# Patient Record
Sex: Male | Born: 1945 | Race: White | Hispanic: No | Marital: Married | Smoking: Former smoker
Health system: Southern US, Community
[De-identification: ages and names within clinical notes are randomized; demographics above are authoritative.]

## PROBLEM LIST (undated history)

## (undated) DIAGNOSIS — I10 Essential (primary) hypertension: Secondary | ICD-10-CM

## (undated) DIAGNOSIS — J449 Chronic obstructive pulmonary disease, unspecified: Secondary | ICD-10-CM

---

## 2007-05-18 ENCOUNTER — Ambulatory Visit: Payer: Self-pay | Admitting: Gastroenterology

## 2008-06-07 ENCOUNTER — Ambulatory Visit: Payer: Self-pay | Admitting: Family Medicine

## 2008-08-15 ENCOUNTER — Ambulatory Visit: Payer: Self-pay | Admitting: Sports Medicine

## 2009-05-13 ENCOUNTER — Ambulatory Visit: Payer: Self-pay | Admitting: Cardiology

## 2014-06-05 DIAGNOSIS — Z87891 Personal history of nicotine dependence: Secondary | ICD-10-CM | POA: Insufficient documentation

## 2017-11-23 ENCOUNTER — Ambulatory Visit: Payer: Self-pay | Admitting: Podiatry

## 2017-11-28 ENCOUNTER — Encounter: Payer: Self-pay | Admitting: Podiatry

## 2017-11-28 ENCOUNTER — Other Ambulatory Visit: Payer: Self-pay | Admitting: Podiatry

## 2017-11-28 ENCOUNTER — Ambulatory Visit (INDEPENDENT_AMBULATORY_CARE_PROVIDER_SITE_OTHER): Payer: No Typology Code available for payment source | Admitting: Podiatry

## 2017-11-28 ENCOUNTER — Ambulatory Visit (INDEPENDENT_AMBULATORY_CARE_PROVIDER_SITE_OTHER): Payer: No Typology Code available for payment source

## 2017-11-28 ENCOUNTER — Ambulatory Visit: Payer: Self-pay | Admitting: Podiatry

## 2017-11-28 VITALS — BP 114/59 | HR 82 | Temp 97.5°F

## 2017-11-28 DIAGNOSIS — M79672 Pain in left foot: Principal | ICD-10-CM

## 2017-11-28 DIAGNOSIS — M79676 Pain in unspecified toe(s): Secondary | ICD-10-CM | POA: Diagnosis not present

## 2017-11-28 DIAGNOSIS — E785 Hyperlipidemia, unspecified: Secondary | ICD-10-CM | POA: Insufficient documentation

## 2017-11-28 DIAGNOSIS — J449 Chronic obstructive pulmonary disease, unspecified: Secondary | ICD-10-CM | POA: Insufficient documentation

## 2017-11-28 DIAGNOSIS — B351 Tinea unguium: Secondary | ICD-10-CM

## 2017-11-28 DIAGNOSIS — M722 Plantar fascial fibromatosis: Secondary | ICD-10-CM

## 2017-11-28 DIAGNOSIS — I10 Essential (primary) hypertension: Secondary | ICD-10-CM | POA: Insufficient documentation

## 2017-11-28 DIAGNOSIS — M79671 Pain in right foot: Secondary | ICD-10-CM

## 2017-11-28 DIAGNOSIS — Z77090 Contact with and (suspected) exposure to asbestos: Secondary | ICD-10-CM | POA: Insufficient documentation

## 2017-11-28 DIAGNOSIS — E669 Obesity, unspecified: Secondary | ICD-10-CM | POA: Insufficient documentation

## 2017-11-28 NOTE — Progress Notes (Signed)
   SUBJECTIVE 72 year old male presenting today as a new patient with a chief complaint of bilateral foot pain that began over 5 years ago. He reports associated numbness and tingling of the toes as well as swelling and a burning sensation to the feet. He states the pain is located to the dorsum of the foot as well as the heels and toes. Walking and standing for long periods of time increases the pain. He has not done anything for treatment. Patient is also complaining of elongated, thickened nails that cause pain while ambulating in shoes. He is unable to trim his own nails. Patient is here for further evaluation and treatment.  History reviewed. No pertinent past medical history.  OBJECTIVE General Patient is awake, alert, and oriented x 3 and in no acute distress.  Derm Skin is dry and supple bilateral. Negative open lesions or macerations. Remaining integument unremarkable. Nails are tender, long, thickened and dystrophic with subungual debris, consistent with onychomycosis, 1-5 bilateral. No signs of infection noted.  Vasc  DP and PT pedal pulses palpable bilaterally. Temperature gradient within normal limits.   Neuro Epicritic and protective threshold sensation grossly intact bilaterally.   Musculoskeletal Exam Tenderness to palpation to the plantar aspect of the bilateral heels along the plantar fascia. All other joints range of motion within normal limits bilateral. Strength 5/5 in all groups bilateral.   Radiographic exam: Normal osseous mineralization. Joint spaces preserved. No fracture/dislocation/boney destruction. No other soft tissue abnormalities or radiopaque foreign bodies.   ASSESSMENT 1. Onychodystrophic nails 1-5 bilateral with hyperkeratosis of nails.  2. Onychomycosis of nail due to dermatophyte bilateral 3. Pain in foot bilateral 4. Plantar fasciitis bilateral   PLAN OF CARE 1. Patient evaluated today. X-Rays reviewed.  2. Instructed to maintain good pedal hygiene  and foot care.  3. Mechanical debridement of nails 1-5 bilaterally performed using a nail nipper. Filed with dremel without incident.  4. Injection of 0.5 mLs Celestone Soluspan injected into the bilateral heels.  5. Continue taking Sulindac 200 mg as directed by PCP.  6. Continue wearing DM shoes and insoles from [facility].  7. Return to clinic in 3 mos.    Felecia ShellingBrent M. Evans, DPM [facility]  Dr. Felecia ShellingBrent M. Evans, DPM    [address]                                        [place], [place] [address]                Office [phone]  Fax [phone]

## 2018-02-27 ENCOUNTER — Encounter: Payer: Self-pay | Admitting: Podiatry

## 2018-02-27 ENCOUNTER — Ambulatory Visit: Payer: Non-veteran care | Admitting: Podiatry

## 2018-02-27 ENCOUNTER — Ambulatory Visit (INDEPENDENT_AMBULATORY_CARE_PROVIDER_SITE_OTHER): Payer: Non-veteran care | Admitting: Podiatry

## 2018-02-27 DIAGNOSIS — B351 Tinea unguium: Secondary | ICD-10-CM

## 2018-02-27 DIAGNOSIS — M79676 Pain in unspecified toe(s): Secondary | ICD-10-CM | POA: Diagnosis not present

## 2018-03-01 NOTE — Progress Notes (Signed)
   SUBJECTIVE Patient presents to office today complaining of elongated, thickened nails that cause pain while ambulating in shoes. He is unable to trim his own nails. Patient is here for further evaluation and treatment.  History reviewed. No pertinent past medical history.  OBJECTIVE General Patient is awake, alert, and oriented x 3 and in no acute distress. Derm Skin is dry and supple bilateral. Negative open lesions or macerations. Remaining integument unremarkable. Nails are tender, long, thickened and dystrophic with subungual debris, consistent with onychomycosis, 1-5 bilateral. No signs of infection noted. Vasc  DP and PT pedal pulses palpable bilaterally. Temperature gradient within normal limits.  Neuro Epicritic and protective threshold sensation grossly intact bilaterally.  Musculoskeletal Exam No symptomatic pedal deformities noted bilateral. Muscular strength within normal limits.  ASSESSMENT 1. Onychodystrophic nails 1-5 bilateral with hyperkeratosis of nails.  2. Onychomycosis of nail due to dermatophyte bilateral 3. Pain in foot bilateral  PLAN OF CARE 1. Patient evaluated today.  2. Instructed to maintain good pedal hygiene and foot care.  3. Mechanical debridement of nails 1-5 bilaterally performed using a nail nipper. Filed with dremel without incident.  4. Return to clinic in 3 mos.    Sharrie Self M. Agron Swiney, DPM [facility]  Dr. Robertta Halfhill M. Kerensa Nicklas, DPM    [address]                                        , [place] [address]                Office [phone]  Fax [phone]      

## 2018-03-05 ENCOUNTER — Telehealth: Payer: Self-pay | Admitting: Podiatry

## 2018-03-05 NOTE — Telephone Encounter (Signed)
Please fax notes to  [phone] Attn: [facility] Team H 6

## 2018-05-29 ENCOUNTER — Encounter: Payer: Self-pay | Admitting: Podiatry

## 2018-05-29 ENCOUNTER — Ambulatory Visit (INDEPENDENT_AMBULATORY_CARE_PROVIDER_SITE_OTHER): Payer: Non-veteran care | Admitting: Podiatry

## 2018-05-29 DIAGNOSIS — M79676 Pain in unspecified toe(s): Secondary | ICD-10-CM

## 2018-05-29 DIAGNOSIS — M722 Plantar fascial fibromatosis: Secondary | ICD-10-CM

## 2018-05-29 DIAGNOSIS — B351 Tinea unguium: Secondary | ICD-10-CM | POA: Diagnosis not present

## 2018-06-01 NOTE — Progress Notes (Signed)
   SUBJECTIVE Patient presents to office today complaining of elongated, thickened nails that cause pain while ambulating in shoes. he is unable to trim his own nails. Patient is here for further evaluation and treatment.  No past medical history on file.  OBJECTIVE General Patient is awake, alert, and oriented x 3 and in no acute distress. Derm Skin is dry and supple bilateral. Negative open lesions or macerations. Remaining integument unremarkable. Nails are tender, long, thickened and dystrophic with subungual debris, consistent with onychomycosis, 1-5 bilateral. No signs of infection noted. Vasc  DP and PT pedal pulses palpable bilaterally. Temperature gradient within normal limits.  Neuro Epicritic and protective threshold sensation grossly intact bilaterally.  Musculoskeletal Exam Tenderness to palpation to the plantar aspect of the bilateral heels along the plantar fascia. No symptomatic pedal deformities noted bilateral. Muscular strength within normal limits.  ASSESSMENT 1. Onychodystrophic nails 1-5 bilateral with hyperkeratosis of nails.  2. Onychomycosis of nail due to dermatophyte bilateral 3. Plantar fasciitis bilateral   PLAN OF CARE 1. Patient evaluated today.  2. Instructed to maintain good pedal hygiene and foot care.  3. Mechanical debridement of nails 1-5 bilaterally performed using a nail nipper. Filed with dremel without incident.  4. Injection of 0.5 mLs Celestone Soluspan injected into the heels bilaterally.  5. Recommended good shoe gear.  6. Return to clinic in 3 mos.    Felecia ShellingBrent M. Slaton Reaser, DPM [facility]  Dr. Felecia ShellingBrent M. Kyrianna Barletta, DPM    [address]                                        [place], [place] [address]                Office [phone]  Fax [phone]

## 2018-08-28 ENCOUNTER — Encounter: Payer: Self-pay | Admitting: Podiatry

## 2018-08-28 ENCOUNTER — Ambulatory Visit (INDEPENDENT_AMBULATORY_CARE_PROVIDER_SITE_OTHER): Payer: Non-veteran care | Admitting: Podiatry

## 2018-08-28 ENCOUNTER — Other Ambulatory Visit: Payer: Self-pay

## 2018-08-28 DIAGNOSIS — M722 Plantar fascial fibromatosis: Secondary | ICD-10-CM

## 2018-08-28 DIAGNOSIS — B351 Tinea unguium: Secondary | ICD-10-CM

## 2018-08-28 DIAGNOSIS — M79676 Pain in unspecified toe(s): Secondary | ICD-10-CM | POA: Diagnosis not present

## 2018-08-28 NOTE — Progress Notes (Signed)
   SUBJECTIVE Patient presents to office today complaining of elongated, thickened nails that cause pain while ambulating in shoes. he is unable to trim his own nails. Patient is here for further evaluation and treatment. Patient states that his plantar fasciitis improved with the injections. Currently experiences no pain.   No past medical history on file.  OBJECTIVE General Patient is awake, alert, and oriented x 3 and in no acute distress. Derm Skin is dry and supple bilateral. Negative open lesions or macerations. Remaining integument unremarkable. Nails are tender, long, thickened and dystrophic with subungual debris, consistent with onychomycosis, 1-5 bilateral. No signs of infection noted. Vasc  DP and PT pedal pulses palpable bilaterally. Temperature gradient within normal limits.  Neuro Epicritic and protective threshold sensation grossly intact bilaterally.  Musculoskeletal Exam Negative for tenderness to palpation to the plantar aspect of the bilateral heels along the plantar fascia. No symptomatic pedal deformities noted bilateral. Muscular strength within normal limits.  ASSESSMENT 1. Onychodystrophic nails 1-5 bilateral with hyperkeratosis of nails.  2. Onychomycosis of nail due to dermatophyte bilateral 3. Plantar fasciitis bilateral - resolved  PLAN OF CARE 1. Patient evaluated today.  2. Instructed to maintain good pedal hygiene and foot care.  3. Mechanical debridement of nails 1-5 bilaterally performed using a nail nipper. Filed with dremel without incident.  4. Recommended good shoe gear.  5. Return to clinic in 3 mos.    Felecia Shelling, DPM [facility]  Dr. Felecia Shelling, DPM    [address]                                        [place], [place] [address]                Office [phone]  Fax [phone]

## 2018-10-02 ENCOUNTER — Encounter: Payer: Self-pay | Admitting: Podiatry

## 2018-10-02 ENCOUNTER — Other Ambulatory Visit: Payer: Self-pay

## 2018-10-02 ENCOUNTER — Ambulatory Visit (INDEPENDENT_AMBULATORY_CARE_PROVIDER_SITE_OTHER): Payer: Non-veteran care | Admitting: Podiatry

## 2018-10-02 VITALS — Temp 98.1°F

## 2018-10-02 DIAGNOSIS — M722 Plantar fascial fibromatosis: Secondary | ICD-10-CM

## 2018-10-08 NOTE — Progress Notes (Signed)
   Subjective: 73 y.o. male presents to the office today for evaluation of bilateral foot pain.  Right greater than the left.  Patient has a history of plantar fasciitis in the past.  He is requesting injections today.  Patient has not done anything currently for treatment.  No past medical history on file.   Objective: Physical Exam General: The patient is alert and oriented x3 in no acute distress.  Dermatology: Skin is warm, dry and supple bilateral lower extremities. Negative for open lesions or macerations bilateral.   Vascular: Dorsalis Pedis and Posterior Tibial pulses palpable bilateral.  Capillary fill time is immediate to all digits.  Neurological: Epicritic and protective threshold intact bilateral.   Musculoskeletal: Tenderness to palpation to the plantar aspect of the bilateral heels along the plantar fascia. All other joints range of motion within normal limits bilateral. Strength 5/5 in all groups bilateral.    Assessment: 1. plantar fasciitis bilateral feet  Plan of Care:  1. Patient evaluated. 2. Injection of 0.5cc Celestone soluspan injected into the bilateral heels.  3.  Continue naproxen 500 mg twice daily 4. Instructed patient regarding therapies and modalities at home to alleviate symptoms.  5. Return to clinic in 4 weeks.    Felecia Shelling, DPM [facility]  Dr. Felecia Shelling, DPM    [address] [place], [place] [address]                Office [phone]  Fax [phone]

## 2018-12-03 ENCOUNTER — Ambulatory Visit (INDEPENDENT_AMBULATORY_CARE_PROVIDER_SITE_OTHER): Payer: No Typology Code available for payment source | Admitting: Podiatry

## 2018-12-03 ENCOUNTER — Encounter: Payer: Self-pay | Admitting: Podiatry

## 2018-12-03 ENCOUNTER — Other Ambulatory Visit: Payer: Self-pay

## 2018-12-03 DIAGNOSIS — B351 Tinea unguium: Secondary | ICD-10-CM | POA: Diagnosis not present

## 2018-12-03 DIAGNOSIS — M79674 Pain in right toe(s): Secondary | ICD-10-CM | POA: Diagnosis not present

## 2018-12-03 DIAGNOSIS — M79675 Pain in left toe(s): Secondary | ICD-10-CM

## 2018-12-03 NOTE — Progress Notes (Signed)
Complaint:  Visit Type: Patient returns to my office for continued preventative foot care services. Complaint: Patient states" my nails have grown long and thick and become painful to walk and wear shoes" Patient has been under treatment for plantar fasciitis by Dr.  Amalia Hailey.. The patient presents for preventative foot care services. No changes to ROS  Podiatric Exam: Vascular: dorsalis pedis and posterior tibial pulses are palpable bilateral. Capillary return is immediate. Temperature gradient is WNL. Skin turgor WNL  Sensorium: Normal Semmes Weinstein monofilament test. Normal tactile sensation bilaterally. Nail Exam: Pt has thick disfigured discolored nails with subungual debris noted bilateral entire nail hallux through fifth toenails Ulcer Exam: There is no evidence of ulcer or pre-ulcerative changes or infection. Orthopedic Exam: Muscle tone and strength are WNL. No limitations in general ROM. No crepitus or effusions noted. Foot type and digits show no abnormalities. Bony prominences are unremarkable. Skin: No Porokeratosis. No infection or ulcers  Diagnosis:  Onychomycosis, , Pain in right toe, pain in left toes  Treatment & Plan Procedures and Treatment: Consent by patient was obtained for treatment procedures.   Debridement of mycotic and hypertrophic toenails, 1 through 5 bilateral and clearing of subungual debris. No ulceration, no infection noted.  Return Visit-Office Procedure: Patient instructed to return to the office for a follow up visit 3 months for Dr.  Amalia Hailey  for continued evaluation and treatment.    Gardiner Barefoot DPM

## 2019-03-05 ENCOUNTER — Other Ambulatory Visit: Payer: Self-pay

## 2019-03-05 ENCOUNTER — Encounter: Payer: Self-pay | Admitting: Podiatry

## 2019-03-05 ENCOUNTER — Ambulatory Visit: Payer: Self-pay | Admitting: Podiatry

## 2019-03-05 DIAGNOSIS — B351 Tinea unguium: Secondary | ICD-10-CM | POA: Diagnosis not present

## 2019-03-05 DIAGNOSIS — M722 Plantar fascial fibromatosis: Secondary | ICD-10-CM

## 2019-03-05 DIAGNOSIS — M79674 Pain in right toe(s): Secondary | ICD-10-CM | POA: Diagnosis not present

## 2019-03-05 DIAGNOSIS — M79675 Pain in left toe(s): Secondary | ICD-10-CM | POA: Diagnosis not present

## 2019-03-07 NOTE — Progress Notes (Signed)
   SUBJECTIVE Patient with a history of diabetes mellitus presents to office today complaining of elongated, thickened nails that cause pain while ambulating in shoes. He is unable to trim his own nails.  He also reports pain in the bilateral heels that began about one month ago. He states he has had similar pain in the past that was relieved with an injection and is requesting another. He has not had any recent treatment for the complaint. Walking and being on the feet for long periods of time increases the pain. Patient is here for further evaluation and treatment.   No past medical history on file.  OBJECTIVE General Patient is awake, alert, and oriented x 3 and in no acute distress. Derm Skin is dry and supple bilateral. Negative open lesions or macerations. Remaining integument unremarkable. Nails are tender, long, thickened and dystrophic with subungual debris, consistent with onychomycosis, 1-5 bilateral. No signs of infection noted. Vasc  DP and PT pedal pulses palpable bilaterally. Temperature gradient within normal limits.  Neuro Epicritic and protective threshold sensation diminished bilaterally.  Musculoskeletal Exam Pain with palpation noted to the bilateral heels. No symptomatic pedal deformities noted bilateral. Muscular strength within normal limits.  ASSESSMENT 1. Diabetes Mellitus w/ peripheral neuropathy 2. Onychomycosis of nail due to dermatophyte bilateral 3. Plantar fasciitis bilateral   PLAN OF CARE 1. Patient evaluated today. 2. Instructed to maintain good pedal hygiene and foot care. Stressed importance of controlling blood sugar.  3. Mechanical debridement of nails 1-5 bilaterally performed using a nail nipper. Filed with dremel without incident.  4. Injection of 0.5 mLs Celestone Soluspan injected into the bilateral heels along the plantar fascia.  5. Continue wearing DM shoes.  6. Return to clinic in 3 mos.     Edrick Kins, DPM [facility]  Dr. Edrick Kins, [address]                                        [place], [place] [address]                Office [phone]  Fax [phone]

## 2019-06-04 ENCOUNTER — Ambulatory Visit: Payer: Non-veteran care | Admitting: Podiatry

## 2019-07-16 ENCOUNTER — Other Ambulatory Visit: Payer: Self-pay

## 2019-07-16 ENCOUNTER — Ambulatory Visit: Payer: Non-veteran care | Admitting: Podiatry

## 2019-07-16 DIAGNOSIS — M79674 Pain in right toe(s): Secondary | ICD-10-CM

## 2019-07-16 DIAGNOSIS — B351 Tinea unguium: Secondary | ICD-10-CM

## 2019-07-16 DIAGNOSIS — M79675 Pain in left toe(s): Secondary | ICD-10-CM

## 2019-07-16 DIAGNOSIS — E0843 Diabetes mellitus due to underlying condition with diabetic autonomic (poly)neuropathy: Secondary | ICD-10-CM

## 2019-07-16 DIAGNOSIS — L989 Disorder of the skin and subcutaneous tissue, unspecified: Secondary | ICD-10-CM

## 2019-07-18 NOTE — Progress Notes (Signed)
    Subjective: Patient is a 74 y.o. male presenting to the office today with a chief complaint of painful callus lesion(s) noted to the bilateral toes that have been present for the past few weeks. Walking increases the pain. He has not had any treatment at home.  Patient also complains of elongated, thickened nails that cause pain while ambulating in shoes. He is unable to trim his own nails. Patient presents today for further treatment and evaluation.  No past medical history on file.  Objective:  Physical Exam General: Alert and oriented x3 in no acute distress  Dermatology: Hyperkeratotic lesion(s) present on the bilateral toes. Pain on palpation with a central nucleated core noted. Skin is warm, dry and supple bilateral lower extremities. Negative for open lesions or macerations. Nails are tender, long, thickened and dystrophic with subungual debris, consistent with onychomycosis, 1-5 bilateral. No signs of infection noted.  Vascular: Palpable pedal pulses bilaterally. No edema or erythema noted. Capillary refill within normal limits.  Neurological: Epicritic and protective threshold diminished bilaterally.   Musculoskeletal Exam: Pain on palpation at the keratotic lesion(s) noted. Range of motion within normal limits bilateral. Muscle strength 5/5 in all groups bilateral.  Assessment: 1. Onychodystrophic nails 1-5 bilateral with hyperkeratosis of nails.  2. Onychomycosis of nail due to dermatophyte bilateral 3. Pre-ulcerative callus lesions noted to the bilateral toes    Plan of Care:  1. Patient evaluated. 2. Excisional debridement of keratoic lesion(s) using a chisel blade was performed without incident.  3. Dressed with light dressing. 4. Mechanical debridement of nails 1-5 bilaterally performed using a nail nipper. Filed with dremel without incident.  5. Patient is to return to the clinic in 3 months.   Felecia Shelling, DPM [facility]  Dr. Felecia Shelling,  DPM    [address]                                        [place], [place] [address]                Office [phone]  Fax [phone]

## 2020-03-24 ENCOUNTER — Other Ambulatory Visit: Payer: Self-pay

## 2020-03-24 ENCOUNTER — Ambulatory Visit: Payer: Non-veteran care | Admitting: Podiatry

## 2020-03-24 DIAGNOSIS — L989 Disorder of the skin and subcutaneous tissue, unspecified: Secondary | ICD-10-CM

## 2020-03-24 DIAGNOSIS — M722 Plantar fascial fibromatosis: Secondary | ICD-10-CM

## 2020-03-24 DIAGNOSIS — B351 Tinea unguium: Secondary | ICD-10-CM

## 2020-03-24 DIAGNOSIS — E0843 Diabetes mellitus due to underlying condition with diabetic autonomic (poly)neuropathy: Secondary | ICD-10-CM

## 2020-03-24 DIAGNOSIS — M79675 Pain in left toe(s): Secondary | ICD-10-CM | POA: Diagnosis not present

## 2020-03-24 DIAGNOSIS — M79674 Pain in right toe(s): Secondary | ICD-10-CM

## 2020-03-24 NOTE — Progress Notes (Signed)
   SUBJECTIVE Patient with a history of diabetes mellitus presents to office today complaining of elongated, thickened nails that cause pain while ambulating in shoes. He is unable to trim his own nails.  He also reports pain in the bilateral heels that has recured. He has had injections in the past which has helped. He states he has had similar pain in the past that was relieved with an injection and is requesting another. He has not had any recent treatment for the complaint. Walking and being on the feet for long periods of time increases the pain. Patient is here for further evaluation and treatment.   No past medical history on file.  OBJECTIVE General Patient is awake, alert, and oriented x 3 and in no acute distress. Derm Skin is dry and supple bilateral. Negative open lesions or macerations. Remaining integument unremarkable. Nails are tender, long, thickened and dystrophic with subungual debris, consistent with onychomycosis, 1-5 bilateral. No signs of infection noted. Vasc  DP and PT pedal pulses palpable bilaterally. Temperature gradient within normal limits.  Neuro Epicritic and protective threshold sensation diminished bilaterally.  Musculoskeletal Exam Pain with palpation noted to the bilateral heels. No symptomatic pedal deformities noted bilateral. Muscular strength within normal limits.  ASSESSMENT 1. Diabetes Mellitus w/ peripheral neuropathy 2. Onychomycosis of nail due to dermatophyte bilateral 3. Plantar fasciitis bilateral   PLAN OF CARE 1. Patient evaluated today. 2. Instructed to maintain good pedal hygiene and foot care. Stressed importance of controlling blood sugar.  3. Mechanical debridement of nails 1-5 bilaterally performed using a nail nipper. Filed with dremel without incident.  4. Injection of 0.5 mLs Celestone Soluspan injected into the bilateral heels along the plantar fascia.  5. Continue wearing DM shoes.  6. Return to clinic in 3 mos.     Felecia Shelling, DPM [facility]  Dr. Felecia Shelling, DPM    [address]                                        [place], [place] [address]                Office [phone]  Fax [phone]

## 2020-06-27 ENCOUNTER — Inpatient Hospital Stay: Payer: No Typology Code available for payment source

## 2020-06-27 ENCOUNTER — Other Ambulatory Visit: Payer: Self-pay

## 2020-06-27 ENCOUNTER — Emergency Department: Payer: No Typology Code available for payment source

## 2020-06-27 ENCOUNTER — Inpatient Hospital Stay
Admission: EM | Admit: 2020-06-27 | Discharge: 2020-07-28 | DRG: 207 | Disposition: E | Payer: No Typology Code available for payment source | Attending: Internal Medicine | Admitting: Internal Medicine

## 2020-06-27 DIAGNOSIS — J61 Pneumoconiosis due to asbestos and other mineral fibers: Secondary | ICD-10-CM | POA: Diagnosis present

## 2020-06-27 DIAGNOSIS — G9341 Metabolic encephalopathy: Secondary | ICD-10-CM | POA: Diagnosis present

## 2020-06-27 DIAGNOSIS — J9601 Acute respiratory failure with hypoxia: Secondary | ICD-10-CM

## 2020-06-27 DIAGNOSIS — J9621 Acute and chronic respiratory failure with hypoxia: Secondary | ICD-10-CM | POA: Diagnosis present

## 2020-06-27 DIAGNOSIS — J328 Other chronic sinusitis: Secondary | ICD-10-CM | POA: Diagnosis present

## 2020-06-27 DIAGNOSIS — I255 Ischemic cardiomyopathy: Secondary | ICD-10-CM | POA: Diagnosis present

## 2020-06-27 DIAGNOSIS — E785 Hyperlipidemia, unspecified: Secondary | ICD-10-CM | POA: Diagnosis present

## 2020-06-27 DIAGNOSIS — R0602 Shortness of breath: Secondary | ICD-10-CM

## 2020-06-27 DIAGNOSIS — N179 Acute kidney failure, unspecified: Secondary | ICD-10-CM | POA: Diagnosis present

## 2020-06-27 DIAGNOSIS — I5031 Acute diastolic (congestive) heart failure: Secondary | ICD-10-CM | POA: Diagnosis present

## 2020-06-27 DIAGNOSIS — J9622 Acute and chronic respiratory failure with hypercapnia: Secondary | ICD-10-CM | POA: Diagnosis not present

## 2020-06-27 DIAGNOSIS — Z0189 Encounter for other specified special examinations: Secondary | ICD-10-CM

## 2020-06-27 DIAGNOSIS — Z7982 Long term (current) use of aspirin: Secondary | ICD-10-CM | POA: Diagnosis not present

## 2020-06-27 DIAGNOSIS — Z9981 Dependence on supplemental oxygen: Secondary | ICD-10-CM

## 2020-06-27 DIAGNOSIS — J441 Chronic obstructive pulmonary disease with (acute) exacerbation: Principal | ICD-10-CM | POA: Diagnosis present

## 2020-06-27 DIAGNOSIS — Z66 Do not resuscitate: Secondary | ICD-10-CM | POA: Diagnosis present

## 2020-06-27 DIAGNOSIS — Z87891 Personal history of nicotine dependence: Secondary | ICD-10-CM | POA: Diagnosis not present

## 2020-06-27 DIAGNOSIS — Z6836 Body mass index (BMI) 36.0-36.9, adult: Secondary | ICD-10-CM | POA: Diagnosis not present

## 2020-06-27 DIAGNOSIS — I110 Hypertensive heart disease with heart failure: Secondary | ICD-10-CM | POA: Diagnosis present

## 2020-06-27 DIAGNOSIS — Z888 Allergy status to other drugs, medicaments and biological substances status: Secondary | ICD-10-CM

## 2020-06-27 DIAGNOSIS — T85628A Displacement of other specified internal prosthetic devices, implants and grafts, initial encounter: Secondary | ICD-10-CM | POA: Diagnosis not present

## 2020-06-27 DIAGNOSIS — Z7189 Other specified counseling: Secondary | ICD-10-CM | POA: Diagnosis not present

## 2020-06-27 DIAGNOSIS — Z20822 Contact with and (suspected) exposure to covid-19: Secondary | ICD-10-CM | POA: Diagnosis present

## 2020-06-27 DIAGNOSIS — E876 Hypokalemia: Secondary | ICD-10-CM | POA: Diagnosis present

## 2020-06-27 DIAGNOSIS — J9600 Acute respiratory failure, unspecified whether with hypoxia or hypercapnia: Secondary | ICD-10-CM | POA: Diagnosis present

## 2020-06-27 DIAGNOSIS — U071 COVID-19: Secondary | ICD-10-CM | POA: Diagnosis present

## 2020-06-27 DIAGNOSIS — Z8249 Family history of ischemic heart disease and other diseases of the circulatory system: Secondary | ICD-10-CM

## 2020-06-27 DIAGNOSIS — U099 Post covid-19 condition, unspecified: Secondary | ICD-10-CM | POA: Diagnosis present

## 2020-06-27 DIAGNOSIS — Z79899 Other long term (current) drug therapy: Secondary | ICD-10-CM

## 2020-06-27 DIAGNOSIS — Z79891 Long term (current) use of opiate analgesic: Secondary | ICD-10-CM

## 2020-06-27 DIAGNOSIS — Z885 Allergy status to narcotic agent status: Secondary | ICD-10-CM

## 2020-06-27 DIAGNOSIS — Z515 Encounter for palliative care: Secondary | ICD-10-CM | POA: Diagnosis not present

## 2020-06-27 DIAGNOSIS — Y848 Other medical procedures as the cause of abnormal reaction of the patient, or of later complication, without mention of misadventure at the time of the procedure: Secondary | ICD-10-CM | POA: Diagnosis not present

## 2020-06-27 DIAGNOSIS — R7989 Other specified abnormal findings of blood chemistry: Secondary | ICD-10-CM

## 2020-06-27 DIAGNOSIS — I10 Essential (primary) hypertension: Secondary | ICD-10-CM | POA: Diagnosis not present

## 2020-06-27 DIAGNOSIS — Z01818 Encounter for other preprocedural examination: Secondary | ICD-10-CM

## 2020-06-27 HISTORY — DX: Essential (primary) hypertension: I10

## 2020-06-27 HISTORY — DX: Chronic obstructive pulmonary disease, unspecified: J44.9

## 2020-06-27 LAB — URINALYSIS, COMPLETE (UACMP) WITH MICROSCOPIC
Bacteria, UA: NONE SEEN
Bilirubin Urine: NEGATIVE
Glucose, UA: NEGATIVE mg/dL
Ketones, ur: NEGATIVE mg/dL
Leukocytes,Ua: NEGATIVE
Nitrite: NEGATIVE
Protein, ur: 30 mg/dL — AB
RBC / HPF: >50 RBC/hpf — ABNORMAL HIGH (ref 0–5)
Specific Gravity, Urine: 1.041 — ABNORMAL HIGH (ref 1.005–1.030)
pH: 5 (ref 5.0–8.0)

## 2020-06-27 LAB — BLOOD GAS, ARTERIAL
Acid-Base Excess: 17.2 mmol/L — ABNORMAL HIGH (ref 0.0–2.0)
Acid-Base Excess: 18.1 mmol/L — ABNORMAL HIGH (ref 0.0–2.0)
Bicarbonate: 52.8 mmol/L — ABNORMAL HIGH (ref 20.0–28.0)
Bicarbonate: 47.1 mmol/L — ABNORMAL HIGH (ref 20.0–28.0)
FIO2: 0.44
FIO2: 50
MECHVT: 500 mL
Mechanical Rate: 22
O2 Saturation: 96.7 %
O2 Saturation: 96.6 %
PEEP: 5 cmH2O
Patient temperature: 37
Patient temperature: 37
pCO2 arterial: >120 mmHg (ref 32.0–48.0)
pCO2 arterial: 76 mmHg (ref 32.0–48.0)
pH, Arterial: 7.16 — CL (ref 7.350–7.450)
pH, Arterial: 7.4 (ref 7.350–7.450)
pO2, Arterial: 108 mmHg (ref 83.0–108.0)
pO2, Arterial: 87 mmHg (ref 83.0–108.0)

## 2020-06-27 LAB — GLUCOSE, CAPILLARY: Glucose-Capillary: 151 mg/dL — ABNORMAL HIGH (ref 70–99)

## 2020-06-27 LAB — FIBRIN DERIVATIVES D-DIMER (ARMC ONLY): Fibrin derivatives D-dimer (ARMC): 1539.92 ng/mL (FEU) — ABNORMAL HIGH (ref 0.00–499.00)

## 2020-06-27 LAB — COMPREHENSIVE METABOLIC PANEL
ALT: 27 U/L (ref 0–44)
AST: 31 U/L (ref 15–41)
Albumin: 3.5 g/dL (ref 3.5–5.0)
Alkaline Phosphatase: 78 U/L (ref 38–126)
Anion gap: 13 (ref 5–15)
BUN: 17 mg/dL (ref 8–23)
CO2: 42 mmol/L — ABNORMAL HIGH (ref 22–32)
Calcium: 9.6 mg/dL (ref 8.9–10.3)
Chloride: 86 mmol/L — ABNORMAL LOW (ref 98–111)
Creatinine, Ser: 1.05 mg/dL (ref 0.61–1.24)
GFR, Estimated: >60 mL/min (ref >60)
Glucose, Bld: 118 mg/dL — ABNORMAL HIGH (ref 70–99)
Potassium: 3.4 mmol/L — ABNORMAL LOW (ref 3.5–5.1)
Sodium: 141 mmol/L (ref 135–145)
Total Bilirubin: 0.6 mg/dL (ref 0.3–1.2)
Total Protein: 8.1 g/dL (ref 6.5–8.1)

## 2020-06-27 LAB — CBC WITH DIFFERENTIAL/PLATELET
Abs Immature Granulocytes: 0.04 10*3/uL (ref 0.00–0.07)
Basophils Absolute: 0.1 10*3/uL (ref 0.0–0.1)
Basophils Relative: 1 %
Eosinophils Absolute: 3.2 10*3/uL — ABNORMAL HIGH (ref 0.0–0.5)
Eosinophils Relative: 30 %
HCT: 42.9 % (ref 39.0–52.0)
Hemoglobin: 13.5 g/dL (ref 13.0–17.0)
Immature Granulocytes: 0 %
Lymphocytes Relative: 12 %
Lymphs Abs: 1.2 10*3/uL (ref 0.7–4.0)
MCH: 29.3 pg (ref 26.0–34.0)
MCHC: 31.5 g/dL (ref 30.0–36.0)
MCV: 93.1 fL (ref 80.0–100.0)
Monocytes Absolute: 0.9 10*3/uL (ref 0.1–1.0)
Monocytes Relative: 8 %
Neutro Abs: 5.1 10*3/uL (ref 1.7–7.7)
Neutrophils Relative %: 49 %
Platelets: 241 10*3/uL (ref 150–400)
RBC: 4.61 MIL/uL (ref 4.22–5.81)
RDW: 13.2 % (ref 11.5–15.5)
WBC: 10.5 10*3/uL (ref 4.0–10.5)
nRBC: 0 % (ref 0.0–0.2)

## 2020-06-27 LAB — TROPONIN I (HIGH SENSITIVITY)
Troponin I (High Sensitivity): 10 ng/L (ref <18)
Troponin I (High Sensitivity): 10 ng/L (ref <18)

## 2020-06-27 LAB — MAGNESIUM: Magnesium: 1.7 mg/dL (ref 1.7–2.4)

## 2020-06-27 LAB — PROTIME-INR
INR: 1 (ref 0.8–1.2)
Prothrombin Time: 13.1 seconds (ref 11.4–15.2)

## 2020-06-27 LAB — SARS CORONAVIRUS 2 BY RT PCR (HOSPITAL ORDER, PERFORMED IN ~~LOC~~ HOSPITAL LAB): SARS Coronavirus 2: NEGATIVE

## 2020-06-27 LAB — BRAIN NATRIURETIC PEPTIDE: B Natriuretic Peptide: 29.5 pg/mL (ref 0.0–100.0)

## 2020-06-27 MED ORDER — ASCORBIC ACID 500 MG PO TABS
500.0000 mg | ORAL_TABLET | Freq: Every day | ORAL | Status: DC
  Administered 2020-06-28 – 2020-07-09 (×12): 500 mg
  Filled 2020-06-27 (×13): qty 1

## 2020-06-27 MED ORDER — IPRATROPIUM-ALBUTEROL 0.5-2.5 (3) MG/3ML IN SOLN
3.0000 mL | Freq: Once | RESPIRATORY_TRACT | Status: AC
  Administered 2020-06-27: 3 mL via RESPIRATORY_TRACT
  Filled 2020-06-27: qty 3

## 2020-06-27 MED ORDER — FENTANYL 2500MCG IN NS 250ML (10MCG/ML) PREMIX INFUSION
INTRAVENOUS | Status: AC
  Administered 2020-06-27: 75 ug/h
  Filled 2020-06-27: qty 250

## 2020-06-27 MED ORDER — MIDAZOLAM HCL 5 MG/5ML IJ SOLN
4.0000 mg | Freq: Once | INTRAMUSCULAR | Status: AC
  Administered 2020-06-27: 4 mg via INTRAVENOUS

## 2020-06-27 MED ORDER — ACETAMINOPHEN 650 MG RE SUPP: 650.0000 mg | Freq: Four times a day (QID) | RECTAL | Status: DC | PRN

## 2020-06-27 MED ORDER — POTASSIUM CHLORIDE CRYS ER 20 MEQ PO TBCR
40.0000 meq | EXTENDED_RELEASE_TABLET | Freq: Once | ORAL | Status: AC
  Administered 2020-06-27: 40 meq via ORAL
  Filled 2020-06-27: qty 2

## 2020-06-27 MED ORDER — ALBUTEROL SULFATE (2.5 MG/3ML) 0.083% IN NEBU: 2.5000 mg | INHALATION_SOLUTION | RESPIRATORY_TRACT | Status: DC | PRN

## 2020-06-27 MED ORDER — FENTANYL BOLUS VIA INFUSION
25.0000 ug | INTRAVENOUS | Status: DC | PRN
  Administered 2020-07-04 – 2020-07-08 (×4): 25 ug via INTRAVENOUS
  Filled 2020-06-27: qty 25

## 2020-06-27 MED ORDER — NALOXONE HCL 2 MG/2ML IJ SOSY: 0.4000 mg | PREFILLED_SYRINGE | Freq: Once | INTRAMUSCULAR | Status: AC

## 2020-06-27 MED ORDER — PROPOFOL 1000 MG/100ML IV EMUL
0.0000 ug/kg/min | INTRAVENOUS | Status: DC
  Administered 2020-06-28 (×2): 40 ug/kg/min via INTRAVENOUS
  Administered 2020-06-28: 50 ug/kg/min via INTRAVENOUS
  Administered 2020-06-28: 20 ug/kg/min via INTRAVENOUS
  Administered 2020-06-28: 40 ug/kg/min via INTRAVENOUS
  Administered 2020-06-29 (×2): 30 ug/kg/min via INTRAVENOUS
  Administered 2020-06-29 (×2): 40 ug/kg/min via INTRAVENOUS
  Administered 2020-06-29 – 2020-06-30 (×3): 20 ug/kg/min via INTRAVENOUS
  Administered 2020-06-30 (×3): 40 ug/kg/min via INTRAVENOUS
  Administered 2020-06-30 – 2020-07-01 (×5): 25 ug/kg/min via INTRAVENOUS
  Administered 2020-07-01: 30 ug/kg/min via INTRAVENOUS
  Administered 2020-07-02 – 2020-07-03 (×8): 25 ug/kg/min via INTRAVENOUS
  Administered 2020-07-04: 25.034 ug/kg/min via INTRAVENOUS
  Administered 2020-07-04: 25 ug/kg/min via INTRAVENOUS
  Administered 2020-07-08 (×3): 15 ug/kg/min via INTRAVENOUS
  Administered 2020-07-09 (×3): 20 ug/kg/min via INTRAVENOUS
  Administered 2020-07-10: 40 ug/kg/min via INTRAVENOUS
  Administered 2020-07-10: 15 ug/kg/min via INTRAVENOUS
  Filled 2020-06-27 (×3): qty 100
  Filled 2020-06-27: qty 200
  Filled 2020-06-27 (×8): qty 100
  Filled 2020-06-27: qty 200
  Filled 2020-06-27 (×19): qty 100
  Filled 2020-06-27: qty 200
  Filled 2020-06-27 (×6): qty 100

## 2020-06-27 MED ORDER — ASCORBIC ACID 500 MG PO TABS
500.0000 mg | ORAL_TABLET | Freq: Every day | ORAL | Status: DC
  Administered 2020-06-27: 500 mg via ORAL
  Filled 2020-06-27: qty 1

## 2020-06-27 MED ORDER — DOCUSATE SODIUM 100 MG PO CAPS: 100.0000 mg | ORAL_CAPSULE | Freq: Two times a day (BID) | ORAL | Status: DC | PRN

## 2020-06-27 MED ORDER — METHYLPREDNISOLONE SODIUM SUCC 125 MG IJ SOLR
125.0000 mg | Freq: Once | INTRAMUSCULAR | Status: AC
  Administered 2020-06-27: 125 mg via INTRAVENOUS
  Filled 2020-06-27: qty 2

## 2020-06-27 MED ORDER — ONDANSETRON HCL 4 MG PO TABS: 4.0000 mg | ORAL_TABLET | Freq: Four times a day (QID) | ORAL | Status: DC | PRN

## 2020-06-27 MED ORDER — ENOXAPARIN SODIUM 40 MG/0.4ML ~~LOC~~ SOLN
40.0000 mg | SUBCUTANEOUS | Status: DC
  Administered 2020-06-27 – 2020-07-03 (×7): 40 mg via SUBCUTANEOUS
  Filled 2020-06-27 (×7): qty 0.4

## 2020-06-27 MED ORDER — HYDROCOD POLST-CPM POLST ER 10-8 MG/5ML PO SUER
5.0000 mL | Freq: Once | ORAL | Status: AC
Start: 2020-06-27 — End: 2020-06-27
  Administered 2020-06-27: 5 mL via ORAL
  Filled 2020-06-27: qty 5

## 2020-06-27 MED ORDER — LEVETIRACETAM IN NACL 1000 MG/100ML IV SOLN
1000.0000 mg | Freq: Once | INTRAVENOUS | Status: AC
  Administered 2020-06-27: 1000 mg via INTRAVENOUS
  Filled 2020-06-27: qty 100

## 2020-06-27 MED ORDER — SODIUM CHLORIDE 0.9 % IV SOLN: INTRAVENOUS | Status: DC

## 2020-06-27 MED ORDER — DOCUSATE SODIUM 50 MG/5ML PO LIQD
100.0000 mg | Freq: Two times a day (BID) | ORAL | Status: DC
  Administered 2020-06-27 – 2020-07-06 (×19): 100 mg
  Filled 2020-06-27 (×21): qty 10

## 2020-06-27 MED ORDER — HYDRALAZINE HCL 20 MG/ML IJ SOLN: 5.0000 mg | INTRAMUSCULAR | Status: DC | PRN

## 2020-06-27 MED ORDER — IPRATROPIUM BROMIDE HFA 17 MCG/ACT IN AERS
2.0000 | INHALATION_SPRAY | RESPIRATORY_TRACT | Status: DC
  Administered 2020-06-27 (×2): 2 via RESPIRATORY_TRACT
  Filled 2020-06-27: qty 12.9

## 2020-06-27 MED ORDER — ZINC SULFATE 220 (50 ZN) MG PO CAPS
220.0000 mg | ORAL_CAPSULE | Freq: Every day | ORAL | Status: DC
  Administered 2020-06-28 – 2020-07-09 (×12): 220 mg
  Filled 2020-06-27 (×13): qty 1

## 2020-06-27 MED ORDER — ONDANSETRON HCL 4 MG/2ML IJ SOLN
4.0000 mg | Freq: Once | INTRAMUSCULAR | Status: AC
  Administered 2020-06-27: 4 mg via INTRAVENOUS
  Filled 2020-06-27: qty 2

## 2020-06-27 MED ORDER — FENTANYL CITRATE (PF) 100 MCG/2ML IJ SOLN
100.0000 ug | Freq: Once | INTRAMUSCULAR | Status: AC
  Administered 2020-06-27: 100 ug via INTRAVENOUS

## 2020-06-27 MED ORDER — AZITHROMYCIN 250 MG PO TABS
250.0000 mg | ORAL_TABLET | Freq: Every day | ORAL | Status: AC
  Administered 2020-06-28 – 2020-07-01 (×4): 250 mg
  Filled 2020-06-27 (×5): qty 1

## 2020-06-27 MED ORDER — POLYETHYLENE GLYCOL 3350 17 G PO PACK
17.0000 g | PACK | Freq: Every day | ORAL | Status: DC
  Administered 2020-06-28 – 2020-07-09 (×11): 17 g
  Filled 2020-06-27 (×11): qty 1

## 2020-06-27 MED ORDER — FAMOTIDINE IN NACL 20-0.9 MG/50ML-% IV SOLN
20.0000 mg | Freq: Two times a day (BID) | INTRAVENOUS | Status: DC
  Administered 2020-06-27 – 2020-06-29 (×4): 20 mg via INTRAVENOUS
  Filled 2020-06-27 (×5): qty 50

## 2020-06-27 MED ORDER — IOHEXOL 350 MG/ML SOLN
100.0000 mL | Freq: Once | INTRAVENOUS | Status: AC | PRN
  Administered 2020-06-27: 100 mL via INTRAVENOUS

## 2020-06-27 MED ORDER — FENTANYL 2500MCG IN NS 250ML (10MCG/ML) PREMIX INFUSION
25.0000 ug/h | INTRAVENOUS | Status: DC
  Administered 2020-06-27: 100 ug/h via INTRAVENOUS
  Administered 2020-06-28 – 2020-06-30 (×3): 150 ug/h via INTRAVENOUS
  Administered 2020-07-01 – 2020-07-03 (×2): 50 ug/h via INTRAVENOUS
  Administered 2020-07-04: 75 ug/h via INTRAVENOUS
  Administered 2020-07-06: 50 ug/h via INTRAVENOUS
  Administered 2020-07-09 (×2): 175 ug/h via INTRAVENOUS
  Filled 2020-06-27 (×10): qty 250

## 2020-06-27 MED ORDER — MAGNESIUM SULFATE 2 GM/50ML IV SOLN
2.0000 g | Freq: Once | INTRAVENOUS | Status: AC
  Administered 2020-06-28: 2 g via INTRAVENOUS
  Filled 2020-06-27: qty 50

## 2020-06-27 MED ORDER — DM-GUAIFENESIN ER 30-600 MG PO TB12: 1.0000 | ORAL_TABLET | Freq: Two times a day (BID) | ORAL | Status: DC | PRN

## 2020-06-27 MED ORDER — ALBUTEROL SULFATE HFA 108 (90 BASE) MCG/ACT IN AERS
2.0000 | INHALATION_SPRAY | RESPIRATORY_TRACT | Status: DC | PRN
  Filled 2020-06-27: qty 6.7

## 2020-06-27 MED ORDER — METHYLPREDNISOLONE SODIUM SUCC 125 MG IJ SOLR
60.0000 mg | Freq: Two times a day (BID) | INTRAMUSCULAR | Status: DC
  Administered 2020-06-27 – 2020-06-29 (×5): 60 mg via INTRAVENOUS
  Filled 2020-06-27 (×6): qty 2

## 2020-06-27 MED ORDER — IPRATROPIUM-ALBUTEROL 0.5-2.5 (3) MG/3ML IN SOLN
3.0000 mL | RESPIRATORY_TRACT | Status: DC
  Administered 2020-06-27 – 2020-07-03 (×32): 3 mL via RESPIRATORY_TRACT
  Filled 2020-06-27 (×29): qty 3

## 2020-06-27 MED ORDER — ENOXAPARIN SODIUM 40 MG/0.4ML ~~LOC~~ SOLN: 40.0000 mg | SUBCUTANEOUS | Status: DC

## 2020-06-27 MED ORDER — AZITHROMYCIN 250 MG PO TABS: 250.0000 mg | ORAL_TABLET | Freq: Every day | ORAL | Status: DC

## 2020-06-27 MED ORDER — PROPOFOL 1000 MG/100ML IV EMUL
INTRAVENOUS | Status: AC
  Administered 2020-06-27: 10 ug/kg/min via INTRAVENOUS
  Filled 2020-06-27: qty 100

## 2020-06-27 MED ORDER — AZITHROMYCIN 500 MG PO TABS
500.0000 mg | ORAL_TABLET | Freq: Every day | ORAL | Status: AC
  Administered 2020-06-27: 500 mg via ORAL
  Filled 2020-06-27: qty 1

## 2020-06-27 MED ORDER — CHLORHEXIDINE GLUCONATE CLOTH 2 % EX PADS
6.0000 | MEDICATED_PAD | Freq: Every day | CUTANEOUS | Status: DC
  Administered 2020-06-28 – 2020-07-09 (×11): 6 via TOPICAL

## 2020-06-27 MED ORDER — ETOMIDATE 2 MG/ML IV SOLN
20.0000 mg | Freq: Once | INTRAVENOUS | Status: AC
  Administered 2020-06-27: 20 mg via INTRAVENOUS

## 2020-06-27 MED ORDER — POLYETHYLENE GLYCOL 3350 17 G PO PACK: 17.0000 g | PACK | Freq: Every day | ORAL | Status: DC | PRN

## 2020-06-27 MED ORDER — NALOXONE HCL 2 MG/2ML IJ SOSY
PREFILLED_SYRINGE | INTRAMUSCULAR | Status: AC
  Administered 2020-06-27: 0.4 mg via INTRAVENOUS
  Filled 2020-06-27: qty 2

## 2020-06-27 MED ORDER — ZINC SULFATE 220 (50 ZN) MG PO CAPS
220.0000 mg | ORAL_CAPSULE | Freq: Every day | ORAL | Status: DC
  Administered 2020-06-27: 220 mg via ORAL
  Filled 2020-06-27: qty 1

## 2020-06-27 MED ORDER — HYDRALAZINE HCL 20 MG/ML IJ SOLN: 10.0000 mg | INTRAMUSCULAR | Status: DC | PRN

## 2020-06-27 NOTE — ED Notes (Signed)
Pt requesting cough medicine at this time, pt states he is coughing so much that he is feeling nauseous. Dr. Lenard Lance, MD made aware. See orders.

## 2020-06-27 NOTE — ED Provider Notes (Signed)
[facility] Emergency Department Provider Note  Time seen: 7:28 AM  I have reviewed the triage vital signs and the nursing notes.   HISTORY  Chief Complaint Shortness of Breath   HPI Kristopher Blake is a 75 y.o. male with a past medical history of COPD, hypertension, hyperlipidemia, presents to the emergency department for shortness of breath and hypoxia.  According to the patient he had Covid approximately 3 weeks ago, was discharged from the hospital with 3 L of oxygen to be worn 24/7.  Patient states over the past 2 days he has been coughing and feeling short of breath this morning took his oxygen level and it was 82% on 3 L of the patient came to the emergency department for evaluation.  Denies any chest pain.  Denies any significant shortness of breath currently.  Denies any fever recently.  Largely negative review of systems otherwise.   Past Medical History:  Diagnosis Date  . COPD (chronic obstructive pulmonary disease) (HCC)   . Hypertension     Patient Active Problem List   Diagnosis Date Noted  . Pain due to onychomycosis of toenails of both feet 12/03/2018  . COPD (chronic obstructive pulmonary disease) (HCC) 11/28/2017  . H/O asbestos exposure 11/28/2017  . HLD (hyperlipidemia) 11/28/2017  . HTN (hypertension) 11/28/2017  . Obesity, unspecified 11/28/2017  . Former cigarette smoker 06/05/2014    History reviewed. No pertinent surgical history.  Prior to Admission medications   Medication Sig Start Date End Date Taking? Authorizing Provider  aspirin EC 81 MG tablet Take by mouth.    [provider]  ATORVASTATIN CALCIUM PO Take by mouth.    [provider]  camphor-menthol Wynelle Fanny) lotion Apply 1 application topically as needed for itching.    [provider]  indapamide (LOZOL) 2.5 MG tablet Take by mouth.    [provider]  oxyCODONE (OXY IR/ROXICODONE) 5 MG immediate release tablet Take by mouth.     [provider]  sildenafil (REVATIO) 2.5 mg/mL SUSP Take 20 mg by mouth 3 (three) times daily.    [provider]  sulindac (CLINORIL) 200 MG tablet Take by mouth.    [provider]  valsartan (DIOVAN) 320 MG tablet Take by mouth.    [provider]    Allergies  Allergen Reactions  . Lisinopril   . Tamsulosin   . Tramadol     History reviewed. No pertinent family history.  Social History Social History   Tobacco Use  . Smoking status: Former Games developer  . Smokeless tobacco: Never Used  Substance Use Topics  . Alcohol use: Not Currently    Review of Systems Constitutional: Negative for fever. Cardiovascular: Negative for chest pain. Respiratory: Positive shortness of breath.  Positive for cough. Gastrointestinal: Negative for abdominal pain, vomiting and diarrhea. Musculoskeletal: Negative for leg pain or swelling. Skin: Negative for skin complaints  Neurological: Negative for headache All other ROS negative  ____________________________________________   PHYSICAL EXAM:  VITAL SIGNS: ED Triage Vitals  Enc Vitals Group     BP 06/08/2020 0654 (!) 141/73     Pulse Rate 06/23/2020 0650 95     Resp 06/07/2020 0650 18     Temp 06/23/2020 0650 98.2 F (36.8 C)     Temp Source 06/25/2020 0650 Oral     SpO2 06/11/2020 0650 95 %     Weight 06/22/2020 0651 270 lb (122.5 kg)     Height 06/20/2020 0651 5\' 10"  (1.778 m)  Head Circumference --      Peak Flow --      Pain Score 07/05/2020 0651 2     Pain Loc --      Pain Edu? --      Excl. in GC? --    Constitutional: Alert and oriented. Well appearing and in no distress. Eyes: Normal exam ENT      Head: Normocephalic and atraumatic.      Mouth/Throat: Mucous membranes are moist. Cardiovascular: Normal rate, regular rhythm.  Respiratory: Mild diminished lung sounds bilaterally with mild expiratory wheeze bilaterally. Gastrointestinal: Soft and nontender. No distention.  Musculoskeletal: Nontender  with normal range of motion in all extremities. No lower extremity tenderness Neurologic:  Normal speech and language. No gross focal neurologic deficits are appreciated. Skin:  Skin is warm, dry and intact.  Psychiatric: Mood and affect are normal.  ____________________________________________    EKG  EKG viewed and interpreted by myself shows atrial fibrillation at 84 bpm with a slightly widened QRS, normal axis, largely normal intervals and nonspecific ST changes.  ____________________________________________    RADIOLOGY  CTA negative for acute abnormality. Chest x-ray shows bilateral atelectasis versus pneumonia  ____________________________________________   INITIAL IMPRESSION / ASSESSMENT AND PLAN / ED COURSE  Pertinent labs & imaging results that were available during my care of the patient were reviewed by me and considered in my medical decision making (see chart for details).   Patient presents to the emergency department for worsening shortness of breath found to be hypoxic this morning 82% on his home pulse oximeter.  Currently patient satting in the low 90s on 3 L.  Patient denies any chest pain.  Given the patient's Covid positive status 3 weeks ago now with hypoxemia we will proceed with CTA imaging to rule out pulmonary embolism.  Patient also has a history of COPD with somewhat diminished lung sounds and expiratory wheeze on exam we will dose two duo nebs while awaiting CTA results.  Lab work has been ordered including cardiac enzymes.  Currently pending at this time.  D-dimer elevated.  CTA negative for PE elevated D-dimer and possibly a sequelae of his recent Covid infection.  Patient currently on 3 L satting 86%.  Troponin negative x2.  The remainder the lab work is normal.  Patient is now on 5 L nasal cannula oxygen satting in the low to mid 90s.  Given the patient's negative CTA highly suspect COPD exacerbation.  Patient given 2 duo nebs, dose Solu-Medrol.  Given  the patient's increased oxygen requirement we will admit to the hospital service for further work-up and treatment.  Kristopher Blake was evaluated in Emergency Department on 07/27/2020 for the symptoms described in the history of present illness. He was evaluated in the context of the global COVID-19 pandemic, which necessitated consideration that the patient might be at risk for infection with the SARS-CoV-2 virus that causes COVID-19. Institutional protocols and algorithms that pertain to the evaluation of patients at risk for COVID-19 are in a state of rapid change based on information released by regulatory bodies including the CDC and federal and state organizations. These policies and algorithms were followed during the patient's care in the ED.  ____________________________________________   FINAL CLINICAL IMPRESSION(S) / ED DIAGNOSES  Dyspnea COPD   Minna Antis, MD 07/05/2020 1017

## 2020-06-27 NOTE — Consult Note (Signed)
NAME:  Karen R [place], MRN:  [id number], DOB:  1945/07/14, LOS: 0 ADMISSION DATE:  06/07/2020, CONSULTATION DATE: 06/20/2020 REFERRING MD: Dr. Clyde Lundborg, CHIEF COMPLAINT: Shortness of breath  Brief History:  75 year old male post COVID-19 infection 06/05/2020 presenting to the ED with 2 days of cough and shortness of breath at home after discharge from the [place]. patient developed acute hypercapnic respiratory failure, becoming obtunded requiring rapid sequence intubation and mechanical ventilation admitted to ICU.  History of Present Illness:  75 year old male presenting to the ED from home with 2 days of cough and shortness of breath in the setting of a recent COVID-19 infection on 06/05/2020 with hospitalization at the [place]. Upon discharge from the [place] patient was prescribed 3 L nasal cannula of oxygen to be worn around the clock at home.  Per wife's report patient had a repeat Covid test that was -2 weeks ago. Patient reported to the ED physician upon arrival that on the morning of 06/17/2020 he checked his SPO2 at home and it was 82% on 3 L and came to the ED to be evaluated.  He denied chest pain/recent fever. ED course: D-dimer elevated >> CTA negative for PE & troponins flat at 10 x2.  Due to concern for COPD exacerbation, patient received 2 duo nebs and a dose of Solu-Medrol.  TRH consulted for admission to PCU because of increasing oxygen requirements.  Patient on 5 L nasal cannula SPO2 in mid 90s.  At the time of hospitalist assessment patient appeared to have a altered mental status, different from initial presentation.  Additional work-up performed for acute encephalopathy with intermittent "jerks" causing concern for possible seizures activity including bilateral lower extremity ultrasound negative for DVT, consultation with neurology and Keppra administration.  ABG revealed PCO2 at 148, PCCM stat consulted for possible need of emergent intubation and mechanical ventilation due to obtunded LOC.  Past  Medical History:  COPD -3 L nasal cannula chronic O2 Hypertension Asbestos exposure Hyperlipidemia Obesity Former smoker  Significant Hospital Events:  06/21/2020-admitted by [facility], became obtunded while rooming in the ED, PCO2 148, requiring RSI and mechanical ventilation.  Admitted to the ICU  Consults:    Procedures:  06/26/2020 ETT >>  Significant Diagnostic Tests:  06/18/2020 CTa chest >> no acute findings, negative for PE, no evidence of pneumonia or pulmonary edema.  Pleural plaques bilaterally compatible with sequela of asbestos exposure.  Asymmetrically prominent pleural thickening in the right upper lobe.   06/19/2020 [place] BLE >> negative for DVT 06/26/2020 CT head >> Micro Data:  06/23/2020 COVID-19 >>  Antimicrobials:  06/28/2020 azithromycin >>  Interim History / Subjective:  Patient unresponsive upon arrival to ED room, RASS -5.  SPO2 in the 90s on 2 L nasal cannula, abdominal accessory muscle use noted.  Narcan given without effect. Wife bedside, discussed recent ABG results in combination with LOC and the need to support the patient with rapid sequence intubation and mechanical ventilation. 19:00 vitals (prior to RSI): HR 86, BP 138/67 (87), RR 14, SpO2 96% on 15 L Labs/ Imaging personally reviewed Na+/ K+: 141/3.4 BUN/Cr.:  17/1.05 Hgb: 13.5  WBC/ TMAX: 10.5/afebrile BNP: 29.5 Troponin: Flat at 10 x2  ABG: 7.16/148/108/52.8 CXR 06/15/2020: ET tube in stable position, low lung volumes with increasing bibasilar opacities likely atelectasis. Objective   Blood pressure (!) 141/68, pulse 81, temperature 98 F (36.7 C), temperature source Oral, resp. rate 15, height 5\' 10"  (1.778 m), weight 122.5 kg, SpO2 97 %.    Vent Mode: AC FiO2 (%):  [  50 %] 50 % Set Rate:  [22 bmp] 22 bmp Vt Set:  [500 mL] 500 mL PEEP:  [5 cmH20] 5 cmH20  No intake or output data in the 24 hours ending 07/02/20 2021 Filed Weights   07/02/2020 0651  Weight: 122.5 kg    Examination: General:  Adult male, critically ill, lying in bed obtunded requiring rapid sequence intubation and mechanical ventilation. HEENT: MM pink/moist, anicteric, atraumatic, neck supple Neuro: Unresponsive/RASS -5, unable to follow commands, PERRL +2 CV: s1s2 RRR, NSR on monitor, no r/m/g Pulm: Regular, abdominal muscle use, breath sounds diminished with expiratory wheezing-BUL & diminished-BLL GI: soft, rounded, bs x 4 Skin: Limited exam- no rashes/lesions noted Extremities: warm/dry, pulses + 2 R/P, no edema noted  Resolved Hospital Problem list     Assessment & Plan:  Acute Hypoxic / Hypercapnic Respiratory Failure  Secondary to COPD exacerbation / in the setting of  previous COVID-19 infection (per wife COVID-19 + 06/05/2020)  PMHx: COPD, asbestos exposure CTa chest negative for pneumonia/pulmonary edema - Ventilator settings: PRVC  8 mL/kg, 50% FiO2, 5 PEEP, continue ventilator support & lung protective strategies - Wean PEEP & FiO2 as tolerated, maintain SpO2 > 90% - Head of bed elevated 30 degrees, VAP protocol in place - Plateau pressures less than 30 cm H20  - Intermittent chest x-ray & ABG PRN - Daily WUA with SBT as tolerated  - Ensure adequate pulmonary hygiene  - perform COVID-19 swab, no record of previous infection, utilize airborne and contact precautions as needed - PAD protocol in place: continue Fentanyl drip & Propofol drip -Continue scheduled DuoNebs & bronchodilators as needed -Continue azithromycin for total of 5 days -Continue methylprednisolone 60 mg twice daily -Continue vitamin C & zinc - Strict I/O's: alert provider if UOP < 0.5 mL/kg/hr -Daily CBC, monitor WBC/fever curve  Acute encephalopathy in the setting of respiratory acidosis PMHx: COPD Initial concern for possible seizure-like activity, low suspicion given how elevated PCO2 was on ABG (7.16/ >>120/108/52.8) - continue with CT head, hold off on MRI brain if CT head negative - Daily BMP, replace electrolytes  PRN - Neurochecks every 4 h  Hyperlipidemia Hypertension We will verify with wife home regimen. -Continue hydralazine PRN to maintain SBP less than 165  Best practice (evaluated daily)  Diet: N.p.o. Pain/Anxiety/Delirium protocol (if indicated): Fentanyl and propofol drips VAP protocol (if indicated): Initiated DVT prophylaxis: Enoxaparin GI prophylaxis: Pepcid IV Glucose control: Every 4 CBG monitor Mobility: Bedrest, mobilize as tolerated Disposition: ICU  Goals of Care:  Last date of multidisciplinary goals of care discussion: Jul 02, 2020 Family and staff present: MD, APP, wife & daughter Summary of discussion: Discussed the need for rapid sequence intubation due to LOC and hypercapnia.  Discussed plan of care overnight, all questions and concerns answered. Follow up goals of care discussion due: 06/28/2020 Code Status: Full  Labs   CBC: Recent Labs  Lab Jul 02, 2020 0658  WBC 10.5  NEUTROABS 5.1  HGB 13.5  HCT 42.9  MCV 93.1  PLT 241    Basic Metabolic Panel: Recent Labs  Lab July 02, 2020 0658 07-02-20 0901  NA 141  --   K 3.4*  --   CL 86*  --   CO2 42*  --   GLUCOSE 118*  --   BUN 17  --   CREATININE 1.05  --   CALCIUM 9.6  --   MG  --  1.7   GFR: Estimated Creatinine Clearance: 78.6 mL/min (by C-G formula based on SCr of 1.05 mg/dL).  Recent Labs  Lab 05/30/2020 0658  WBC 10.5    Liver Function Tests: Recent Labs  Lab 06/12/2020 0658  AST 31  ALT 27  ALKPHOS 78  BILITOT 0.6  PROT 8.1  ALBUMIN 3.5   No results for input(s): LIPASE, AMYLASE in the last 168 hours. No results for input(s): AMMONIA in the last 168 hours.  ABG    Component Value Date/Time   PHART 7.16 (LL) 06/11/2020 1804   PCO2ART >120.0 (HH) 06/14/2020 1804   PO2ART 108 06/05/2020 1804   HCO3 52.8 (H) 06/21/2020 1804   O2SAT 96.7 06/02/2020 1804     Coagulation Profile: Recent Labs  Lab 06/19/2020 0658  INR 1.0    Cardiac Enzymes: No results for input(s): CKTOTAL, CKMB,  CKMBINDEX, TROPONINI in the last 168 hours.  HbA1C: No results found for: HGBA1C  CBG: No results for input(s): GLUCAP in the last 168 hours.  Review of Systems:   UTA-patient obtunded requiring rapid sequence intubation and mechanical ventilation.  Past Medical History:  He,  has a past medical history of COPD (chronic obstructive pulmonary disease) (HCC) and Hypertension.   Surgical History:  History reviewed. No pertinent surgical history.   Social History:   reports that he has quit smoking. He has never used smokeless tobacco. He reports previous alcohol use. He reports that he does not use drugs.   Family History:  His family history includes Heart disease in his sister.   Allergies Allergies  Allergen Reactions  . Lisinopril   . Tamsulosin   . Tramadol      Home Medications  Prior to Admission medications   Medication Sig Start Date End Date Taking? Authorizing Provider  aspirin EC 81 MG tablet Take by mouth.    [provider]  ATORVASTATIN CALCIUM PO Take by mouth.    [provider]  camphor-menthol Wynelle Fanny) lotion Apply 1 application topically as needed for itching.    [provider]  indapamide (LOZOL) 2.5 MG tablet Take by mouth.    [provider]  oxyCODONE (OXY IR/ROXICODONE) 5 MG immediate release tablet Take by mouth.    [provider]  sildenafil (REVATIO) 2.5 mg/mL SUSP Take 20 mg by mouth 3 (three) times daily.    [provider]  sulindac (CLINORIL) 200 MG tablet Take by mouth.    [provider]  valsartan (DIOVAN) 320 MG tablet Take by mouth.    [provider]     Critical care time: 55 minutes       Betsey Holiday, AGACNP-BC Acute Care Nurse Practitioner [facility]   [phone] / [phone] Please see Amion for pager details.

## 2020-06-27 NOTE — ED Triage Notes (Signed)
Hx of COPD. COVID + earlier this month with negative test 2 wks ago. Pt was hospitalized for COVID prior. D/c home with 3L oxygen. Spo2 89-90 baseline and today on waking was 80% on the 3L [place]. Pt arrives on 6 L [place] and sat 95%. Lower lobes diminished bilaterally.

## 2020-06-27 NOTE — ED Notes (Signed)
Patient belongings: 1 hearing aid at bedside in specimen cup. Glasses at bedside

## 2020-06-27 NOTE — ED Notes (Signed)
Respiratory @ bedside

## 2020-06-27 NOTE — ED Notes (Signed)
US at bedside

## 2020-06-27 NOTE — ED Notes (Addendum)
Pt O2 levels fluctuating between the mid 80s and low 90s on 3L [place]. Pt increased to 5L [place], O2 now 92% on 5L [place]

## 2020-06-27 NOTE — ED Notes (Addendum)
7.5 ETT secured at 25 at teeth.

## 2020-06-27 NOTE — ED Notes (Signed)
Dinner meal tray given to pt at this time.  

## 2020-06-27 NOTE — ED Notes (Signed)
[phone] Wife   Hilda Lias

## 2020-06-27 NOTE — Consult Note (Signed)
PHARMACY CONSULT NOTE - FOLLOW UP  Pharmacy Consult for Electrolyte Monitoring and Replacement   Recent Labs: Potassium (mmol/L)  Date Value  06/03/2020 3.4 (L)   Magnesium (mg/dL)  Date Value  41/96/2229 1.7   Calcium (mg/dL)  Date Value  79/89/2119 9.6   Albumin (g/dL)  Date Value  41/74/0814 3.5   Sodium (mmol/L)  Date Value  06/03/2020 141     Assessment: 75 y/o male presented on 1/29 with altered mental status and SOB requiring emergency intubation in ED. Pharmacy has been consulted for electrolyte management.  1/29: Patient already given 40 mEq oral K for K of 3.4 mmol/L  Goal of Therapy:  electroltes WNL  Plan:   Will give 2 gram IV magnesium x 1  Follow up with AM Mag,Phos, BMP  Sharen Hones ,PharmD Clinical Pharmacist 06/21/2020 9:05 PM

## 2020-06-27 NOTE — ED Notes (Signed)
Positive color change noted by respiratory.

## 2020-06-27 NOTE — ED Notes (Signed)
Patient declined gown or blanket at this time.

## 2020-06-27 NOTE — ED Notes (Signed)
Patient pulled up in bed by this RN and Jeannene Patella

## 2020-06-27 NOTE — ED Notes (Signed)
Patient transported to CT by Sheffield Slider, RN and RT.

## 2020-06-27 NOTE — ED Notes (Signed)
Pt O2 continuously fluctuating between mid 80s and low 90s.  Pt O2 flow rate increased to 6L [place].

## 2020-06-27 NOTE — ED Notes (Signed)
Attending bedside. Given 0.4 narcan per verbal order.

## 2020-06-27 NOTE — ED Notes (Signed)
Pt currently sleeping at this time. Pt's wife at bedside. No needs expressed. VSS.

## 2020-06-27 NOTE — H&P (Addendum)
History and Physical    Kristopher Blake TDV:[id number] DOB: [birth year] DOA: 06-29-20  Referring MD/NP/PA:   PCP: Patient, No Pcp Per   Patient coming from:  The patient is coming from home.  At baseline, pt is independent for most of ADL.        Chief Complaint: Shortness of breath and AMS  HPI: Kristopher Blake is a 75 y.o. male with medical history significant of hypertension, hyperlipidemia, COPD on 3 L oxygen, former smoker, obesity, who presents with shortness breath and AMS  Patient was recently hospitalized to [place] due to COVID-19 infection.  He had positive Covid test on 06/05/2020.  The repeat Covid test was negative 2 weeks ago per his wife. Pt was discharged home on 3 L oxygen.  Patient continues to have cough and short of breath.  Shortness breath has been worsened in the past 2 days.  He was found to have oxygen desaturation to 82% on home 3 L oxygen today.  Currently on 5 L oxygen with 96% saturation.  Per his wife, pt was mentally normal before getting Covid infection.  Patient was intermittently confused after recent hospitalization.  Wife states that he had some jerking movement in hospital and was told that that was likley due to dehydration. Per RN report, pt was alert early today. Per EDP's note, pt had "Normal speech and language. No gross focal neurologic deficits are appreciated" early today. In the late afternoon, patient mental status has worsened.  He is barely arousable. He has stiff legs on exam. Per his wife, patient has nausea, no vomiting, diarrhea or abdominal pain, no symptoms of UTI. His wife does not think patient has chest pain, no fever or chills currently. Patient had fever at the beginning of Covid infection.   Patient initially wanted to be transferred to [facility], but [facility] hospital is on diversion.  ED Course: pt was found to have positive D-dimer 1539, WBC 10.5, INR 1.0, troponin level 10, 10, potassium 3.4, renal function okay, temperature normal, blood  pressure 155/86, RR 26, heart rate 102, chest x-ray showed low lung volume with hazy bilateral pulmonary density. CT angiogram is negative for PE. Patient is admitted to progressive bed as inpatient.  Review of Systems: Could not be reviewed due to altered mental status  Allergy:  Allergies  Allergen Reactions  . Lisinopril   . Tamsulosin   . Tramadol     Past Medical History:  Diagnosis Date  . COPD (chronic obstructive pulmonary disease) (HCC)   . Hypertension     History reviewed. No pertinent surgical history.  Social History:  reports that he has quit smoking. He has never used smokeless tobacco. He reports previous alcohol use. He reports that he does not use drugs.  Family History:  Family History  Problem Relation Age of Onset  . Heart disease Sister      Prior to Admission medications   Medication Sig Start Date End Date Taking? Authorizing Provider  aspirin EC 81 MG tablet Take by mouth.    [provider]  ATORVASTATIN CALCIUM PO Take by mouth.    [provider]  camphor-menthol Wynelle Fanny) lotion Apply 1 application topically as needed for itching.    [provider]  indapamide (LOZOL) 2.5 MG tablet Take by mouth.    [provider]  oxyCODONE (OXY IR/ROXICODONE) 5 MG immediate release tablet Take by mouth.    [provider]  sildenafil (REVATIO) 2.5 mg/mL SUSP Take 20 mg by  mouth 3 (three) times daily.    [provider]  sulindac (CLINORIL) 200 MG tablet Take by mouth.    [provider]  valsartan (DIOVAN) 320 MG tablet Take by mouth.    [provider]    Physical Exam: Vitals:   2020/07/12 1430 07-12-2020 1500 07/12/20 1530 Jul 12, 2020 1600  BP: (!) 162/81 (!) 160/81 (!) 146/65 (!) 141/68  Pulse: 90 90 83 81  Resp: 17 16 15 15   Temp:      TempSrc:      SpO2: 96% 95% 96% 97%  Weight:      Height:       General: Not in acute distress HEENT:       Eyes: PERRL, EOMI, no scleral  icterus.       ENT: No discharge from the ears and nose      Neck: No JVD, no bruit, no mass felt. Heme: No neck lymph node enlargement. Cardiac: S1/S2, RRR, No murmurs, No gallops or rubs. Respiratory: has decreased air movement bilaterally GI: Soft, nondistended, nontender, no organomegaly, BS present. GU: No hematuria Ext: No pitting leg edema bilaterally. 1+DP/PT pulse bilaterally. Musculoskeletal: No joint deformities, No joint redness or warmth, no limitation of ROM in spin. Skin: No rashes.  Neuro: Patient is barely arousable, not oriented x3, not following command.  Patient opens his eyes with loud verbal stimuli. Cranial nerves II-XII grossly intact.  Patient has stiff legs bilaterally.  Psych: Patient is not psychotic Labs on Admission: I have personally reviewed following labs and imaging studies  CBC: Recent Labs  Lab 2020/07/12 0658  WBC 10.5  NEUTROABS 5.1  HGB 13.5  HCT 42.9  MCV 93.1  PLT 241   Basic Metabolic Panel: Recent Labs  Lab 12-Jul-2020 0658 2020/07/12 0901  NA 141  --   K 3.4*  --   CL 86*  --   CO2 42*  --   GLUCOSE 118*  --   BUN 17  --   CREATININE 1.05  --   CALCIUM 9.6  --   MG  --  1.7   GFR: Estimated Creatinine Clearance: 78.6 mL/min (by C-G formula based on SCr of 1.05 mg/dL). Liver Function Tests: Recent Labs  Lab 07/12/20 0658  AST 31  ALT 27  ALKPHOS 78  BILITOT 0.6  PROT 8.1  ALBUMIN 3.5   No results for input(s): LIPASE, AMYLASE in the last 168 hours. No results for input(s): AMMONIA in the last 168 hours. Coagulation Profile: Recent Labs  Lab Jul 12, 2020 0658  INR 1.0   Cardiac Enzymes: No results for input(s): CKTOTAL, CKMB, CKMBINDEX, TROPONINI in the last 168 hours. BNP (last 3 results) No results for input(s): PROBNP in the last 8760 hours. HbA1C: No results for input(s): HGBA1C in the last 72 hours. CBG: No results for input(s): GLUCAP in the last 168 hours. Lipid Profile: No results for input(s): CHOL, HDL,  LDLCALC, TRIG, CHOLHDL, LDLDIRECT in the last 72 hours. Thyroid Function Tests: No results for input(s): TSH, T4TOTAL, FREET4, T3FREE, THYROIDAB in the last 72 hours. Anemia Panel: No results for input(s): VITAMINB12, FOLATE, FERRITIN, TIBC, IRON, RETICCTPCT in the last 72 hours. Urine analysis: No results found for: COLORURINE, APPEARANCEUR, LABSPEC, PHURINE, GLUCOSEU, HGBUR, BILIRUBINUR, KETONESUR, PROTEINUR, UROBILINOGEN, NITRITE, LEUKOCYTESUR Sepsis Labs: @LABRCNTIP (procalcitonin:4,lacticidven:4) )No results found for this or any previous visit (from the past 240 hour(s)).   Radiological Exams on Admission: CT Angio Chest PE W and/or Wo Contrast  Result Date: 2020/07/12 CLINICAL DATA:  Three-week post  COVID.  PE suspected. EXAM: CT ANGIOGRAPHY CHEST WITH CONTRAST TECHNIQUE: Multidetector CT imaging of the chest was performed using the standard protocol during bolus administration of intravenous contrast. Multiplanar CT image reconstructions and MIPs were obtained to evaluate the vascular anatomy. CONTRAST:  OMNIPAQUE IOHEXOL 350 MG/ML SOLN COMPARISON:  Chest x-ray from earlier same day. FINDINGS: Cardiovascular: There is no pulmonary embolism identified within the main, lobar or segmental pulmonary arteries bilaterally. No thoracic aortic aneurysm or evidence of aortic dissection. Scattered aortic atherosclerosis. Scattered coronary artery calcifications, particularly dense within the LEFT main and LEFT circumflex coronary arteries. No pericardial effusion. Mediastinum/Nodes: Mediastinal lipomatosis. No mass or enlarged lymph nodes are seen within the mediastinum or perihilar regions. Esophagus appears normal. Trachea is unremarkable. Lungs/Pleura: Pleural plaques bilaterally. Asymmetrically prominent pleural thickening with pleural plaques in the RIGHT upper lobe. Mild bibasilar atelectasis, likely chronic. No pleural effusion or pneumothorax. Upper Abdomen: Limited images of the upper  abdomen are unremarkable. Musculoskeletal: Degenerative spondylosis of the slightly scoliotic thoracic spine, mild to moderate in degree. No acute appearing osseous abnormality. Review of the MIP images confirms the above findings. IMPRESSION: 1. No acute findings. No pulmonary embolism seen. No evidence of pneumonia or pulmonary edema. 2. Pleural plaques bilaterally, compatible with sequela of asbestos exposure. Asymmetrically prominent pleural thickening in the RIGHT upper lobe, with mesothelioma not excluded. Consider PET-CT for further characterization. 3. Scattered coronary artery calcifications, particularly dense within the LEFT main and circumflex coronary arteries. Recommend correlation with any possible associated cardiac symptoms. Aortic Atherosclerosis (ICD10-I70.0). Electronically Signed   By: Bary Richard M.D.   On: 06/18/2020 08:54   US Venous Img Lower Bilateral (DVT)  Result Date: 06/19/2020 CLINICAL DATA:  Positive D-dimer.  No pulmonary emboli EXAM: Bilateral LOWER EXTREMITY VENOUS DOPPLER ULTRASOUND TECHNIQUE: Gray-scale sonography with compression, as well as color and duplex ultrasound, were performed to evaluate the deep venous system(s) from the level of the common femoral vein through the popliteal and proximal calf veins. COMPARISON:  None. FINDINGS: VENOUS Normal compressibility of the common femoral, superficial femoral, and popliteal veins, as well as the visualized calf veins. Visualized portions of profunda femoral vein and great saphenous vein unremarkable. No filling defects to suggest DVT on grayscale or color Doppler imaging. Doppler waveforms show normal direction of venous flow, normal respiratory plasticity and response to augmentation. Limited views of the contralateral common femoral vein are unremarkable. OTHER None. Limitations: none IMPRESSION: No evidence of deep venous thrombosis within the LEFT or RIGHT lower extremity. Electronically Signed   By: Genevive Bi  M.D.   On: 06/26/2020 13:52   DG Chest Port 1 View  Result Date: 06/05/2020 CLINICAL DATA:  COPD.  COVID. EXAM: PORTABLE CHEST 1 VIEW COMPARISON:  None. FINDINGS: Low volume chest with hazy bilateral pulmonary density. No Kerley lines, definite effusion, or pneumothorax. Borderline cardiomegaly, accentuated by technique. IMPRESSION: Low volume chest with bilateral atelectasis or pneumonia Electronically Signed   By: Marnee Spring M.D.   On: 05/30/2020 07:45     EKG: I have personally reviewed.  Seems to be sinus rhythm, QTC 467, right bundle blockade, early R wave progression  Assessment/Plan Principal Problem:   COPD exacerbation (HCC) Active Problems:   HLD (hyperlipidemia)   HTN (hypertension)   Acute on chronic respiratory failure with hypoxia (HCC)   COVID-19 virus infection   Hypokalemia   Acute metabolic encephalopathy  Acute on chronic respiratory failure with hypoxia due to COPD exacerbation [facility]): pt had recent admission due to COVID-19.  He had positive Covid test on 06/05/2020, the repeated Covid test was negative 2 weeks ago per his wife. CTA negative for PE.  Patient has a history of COPD, has decreased air movement bilaterally, possibly has COPD exacerbation.  - will admit to med-surg bed as inpatient -Bronchodilators -Solu-Medrol 60 mg IV bid -Z pak  -Mucinex for cough  -Incentive spirometry -Follow up sputum culture -Nasal cannula oxygen as needed to maintain O2 saturation 93% or greater -will get LE doppler to r/o DVT due to positive D-dimer  Acute metabolic encephalopathy: Etiology is not clear.  Potential differential diagnosis include seizure given stiff legs on examination, COVID-19 encephalopathy, stroke, hypercapnia/CO2 retention.  Neurology, Dr. Wilford Corner is consulted.  I ordered loading dose 1 g of Keppra, Dr. Rosealee Albee to give Keppra now.  He recommended to get CT head/MRI of brain and ABG. Dr. Wilford Corner will see pt in AM.  -hold oral meds until mental status  improves -Frequent neuro check -CT head -MRI of brain -EEG -ABG -Frequent neuro check -Seizure precaution -check UA  Addendum: ABG showed Ph 7.16, CO2 148, O2 is 108. Consulted Dr. Earlie Server of ICU. Pt may need to be intubated.  HLD (hyperlipidemia) -hold lipitor due to AMS  HTN (hypertension) -hold Diovan, indapamide to AMS -IV hydralazine as needed  COVID-19 virus infection: it is more than 21 days, no isolation needed -Vitamin C and zinc sulfate when able to -EDP is requesting test results from [facility] hospital  Hypokalemia: K= 3.4 on admission. - Repleted - Check Mg level    DVT ppx:  SQ Lovenox Code Status: Full code per his wife Family Communication: wife is at bedside Disposition Plan:  Anticipate discharge back to previous environment Consults called:  Dr. Wilford Corner of neuro and Dr. Earlie Server of ICU Admission status and Level of care: Progressive Cardiac as inpt       Status is: Inpatient  Remains inpatient appropriate because:Inpatient level of care appropriate due to severity of illness   Dispo: The patient is from: Home              Anticipated d/c is to: Home              Anticipated d/c date is: 2 days              Patient currently is not medically stable to d/c.   Difficult to place patient No         Date of Service 07/01/2020    Lorretta Harp [facility]   If 7PM-7AM, please contact night-coverage [email or web address] 07/01/20, 6:33 PM

## 2020-06-27 NOTE — ED Notes (Signed)
Pt requesting to sit on the side of the bed. Pt is a low fall risk. Pt assisted to the side of the bed at this time. Call bell within reach.

## 2020-06-27 NOTE — Procedures (Signed)
Intubation Procedure Note  Camdan R [place]  [id number]  January 01, 1946  Date:Jul 20, 2020  Time:8:13 PM   Provider Performing:Jaelynn Currier L Rust[place]ster    Procedure: Intubation (31500)  Indication(s) Respiratory Failure  Consent Risks of the procedure as well as the alternatives and risks of each were explained to the patient and/or caregiver.  Consent for the procedure was obtained and is signed in the bedside chart. Discussed with wife bedside who gave permission for intubation.   Anesthesia Etomidate, Versed and Fentanyl   Time Out Verified patient identification, verified procedure, site/side was marked, verified correct patient position, special equipment/implants available, medications/allergies/relevant history reviewed, required imaging and test results available.   Sterile Technique Usual hand hygeine, masks, and gloves were used   Procedure Description Patient positioned in bed supine.  Sedation given as noted above.  Patient was intubated with a 7.5 endotracheal tube at 26 at the lip using Glidescope.  View was Grade 1 full glottis .  Number of attempts was 1.  Colorimetric CO2 detector was consistent with tracheal placement.   Complications/Tolerance None; patient tolerated the procedure well. Chest X-ray is ordered to verify placement.   EBL Minimal   Specimen(s) None    Betsey Holiday, AGACNP-BC Acute Care Nurse Practitioner [facility]   [phone] / [phone] Please see Amion for pager details.

## 2020-06-27 NOTE — ED Notes (Signed)
Pt transported to CT at this time.

## 2020-06-28 ENCOUNTER — Inpatient Hospital Stay: Payer: No Typology Code available for payment source

## 2020-06-28 DIAGNOSIS — J441 Chronic obstructive pulmonary disease with (acute) exacerbation: Secondary | ICD-10-CM | POA: Diagnosis not present

## 2020-06-28 LAB — BLOOD GAS, ARTERIAL
Acid-Base Excess: 20.1 mmol/L — ABNORMAL HIGH (ref 0.0–2.0)
Acid-Base Excess: 10.2 mmol/L — ABNORMAL HIGH (ref 0.0–2.0)
Allens test (pass/fail): POSITIVE — AB
Bicarbonate: 46 mmol/L — ABNORMAL HIGH (ref 20.0–28.0)
Bicarbonate: 36.1 mmol/L — ABNORMAL HIGH (ref 20.0–28.0)
FIO2: 0.5
FIO2: 0.35
MECHVT: 500 mL
MECHVT: 500 mL
Mechanical Rate: 22
O2 Saturation: 96.2 %
O2 Saturation: 95.2 %
PEEP: 5 cmH2O
PEEP: 5 cmH2O
Patient temperature: 37
Patient temperature: 37
RATE: 14 resp/min
pCO2 arterial: 55 mmHg — ABNORMAL HIGH (ref 32.0–48.0)
pCO2 arterial: 52 mmHg — ABNORMAL HIGH (ref 32.0–48.0)
pH, Arterial: 7.53 — ABNORMAL HIGH (ref 7.350–7.450)
pH, Arterial: 7.45 (ref 7.350–7.450)
pO2, Arterial: 74 mmHg — ABNORMAL LOW (ref 83.0–108.0)
pO2, Arterial: 73 mmHg — ABNORMAL LOW (ref 83.0–108.0)

## 2020-06-28 LAB — CBC
HCT: 37.4 % — ABNORMAL LOW (ref 39.0–52.0)
Hemoglobin: 11.6 g/dL — ABNORMAL LOW (ref 13.0–17.0)
MCH: 28.7 pg (ref 26.0–34.0)
MCHC: 31 g/dL (ref 30.0–36.0)
MCV: 92.6 fL (ref 80.0–100.0)
Platelets: 218 10*3/uL (ref 150–400)
RBC: 4.04 MIL/uL — ABNORMAL LOW (ref 4.22–5.81)
RDW: 13 % (ref 11.5–15.5)
WBC: 7.5 10*3/uL (ref 4.0–10.5)
nRBC: 0 % (ref 0.0–0.2)

## 2020-06-28 LAB — BASIC METABOLIC PANEL
Anion gap: 13 (ref 5–15)
BUN: 30 mg/dL — ABNORMAL HIGH (ref 8–23)
CO2: 36 mmol/L — ABNORMAL HIGH (ref 22–32)
Calcium: 9.1 mg/dL (ref 8.9–10.3)
Chloride: 90 mmol/L — ABNORMAL LOW (ref 98–111)
Creatinine, Ser: 1.46 mg/dL — ABNORMAL HIGH (ref 0.61–1.24)
GFR, Estimated: 50 mL/min — ABNORMAL LOW (ref >60)
Glucose, Bld: 122 mg/dL — ABNORMAL HIGH (ref 70–99)
Potassium: 3.6 mmol/L (ref 3.5–5.1)
Sodium: 139 mmol/L (ref 135–145)

## 2020-06-28 LAB — BLOOD GAS, VENOUS
Acid-Base Excess: 14.4 mmol/L — ABNORMAL HIGH (ref 0.0–2.0)
Bicarbonate: 42.9 mmol/L — ABNORMAL HIGH (ref 20.0–28.0)
O2 Saturation: 90.4 %
Patient temperature: 37
pCO2, Ven: 76 mmHg (ref 44.0–60.0)
pH, Ven: 7.36 (ref 7.250–7.430)
pO2, Ven: 62 mmHg — ABNORMAL HIGH (ref 32.0–45.0)

## 2020-06-28 LAB — GLUCOSE, CAPILLARY
Glucose-Capillary: 127 mg/dL — ABNORMAL HIGH (ref 70–99)
Glucose-Capillary: 127 mg/dL — ABNORMAL HIGH (ref 70–99)
Glucose-Capillary: 130 mg/dL — ABNORMAL HIGH (ref 70–99)
Glucose-Capillary: 142 mg/dL — ABNORMAL HIGH (ref 70–99)
Glucose-Capillary: 150 mg/dL — ABNORMAL HIGH (ref 70–99)
Glucose-Capillary: 150 mg/dL — ABNORMAL HIGH (ref 70–99)

## 2020-06-28 LAB — MAGNESIUM: Magnesium: 2.4 mg/dL (ref 1.7–2.4)

## 2020-06-28 LAB — MRSA PCR SCREENING: MRSA by PCR: NEGATIVE

## 2020-06-28 LAB — TRIGLYCERIDES: Triglycerides: 100 mg/dL (ref <150)

## 2020-06-28 LAB — PHOSPHORUS
Phosphorus: 1.2 mg/dL — ABNORMAL LOW (ref 2.5–4.6)
Phosphorus: 9.8 mg/dL — ABNORMAL HIGH (ref 2.5–4.6)

## 2020-06-28 MED ORDER — SODIUM CHLORIDE 0.9 % IV SOLN: 250.0000 mL | INTRAVENOUS | Status: DC

## 2020-06-28 MED ORDER — POTASSIUM PHOSPHATES 15 MMOLE/5ML IV SOLN
45.0000 mmol | Freq: Once | INTRAVENOUS | Status: AC
  Administered 2020-06-28: 45 mmol via INTRAVENOUS
  Filled 2020-06-28: qty 15

## 2020-06-28 MED ORDER — CHLORHEXIDINE GLUCONATE 0.12% ORAL RINSE (MEDLINE KIT)
15.0000 mL | Freq: Two times a day (BID) | OROMUCOSAL | Status: DC
  Administered 2020-06-28 – 2020-07-09 (×24): 15 mL via OROMUCOSAL

## 2020-06-28 MED ORDER — ORAL CARE MOUTH RINSE
15.0000 mL | OROMUCOSAL | Status: DC
  Administered 2020-06-28 – 2020-07-10 (×120): 15 mL via OROMUCOSAL

## 2020-06-28 MED ORDER — NOREPINEPHRINE 4 MG/250ML-% IV SOLN
2.0000 ug/min | INTRAVENOUS | Status: DC
  Administered 2020-06-28: 2 ug/min via INTRAVENOUS
  Filled 2020-06-28: qty 250

## 2020-06-28 NOTE — Progress Notes (Signed)
Advanced OG tube to 70 cm, awaiting xray to verify placement

## 2020-06-28 NOTE — Progress Notes (Signed)
RR to 18

## 2020-06-28 NOTE — Progress Notes (Signed)
Pt heart rhythm changed to brady, pulses checked not found, Code blue called and CPR started. Within 30 sec pt started moving. MD at the bedside, see new orders. Wife called and notified.

## 2020-06-28 NOTE — Progress Notes (Signed)
NAME:  Kristopher Blake, MRN:  [id number], DOB:  03-11-1946, LOS: 1 ADMISSION DATE:  05/30/2020, INITIAL CONSULTATION DATE: 06/26/2020 REFERRING MD: Dr. Blaine Hamper, CHIEF COMPLAINT: Shortness of breath  Brief History:  75 year old male post COVID-19 infection 06/05/2020 presenting to the ED with 2 days of cough and shortness of breath at home after discharge from the [place]. patient developed acute hypercapnic respiratory failure, becoming obtunded requiring rapid sequence intubation and mechanical ventilation admitted to ICU.  History of Present Illness:  75 year old male presenting to the ED from home with 2 days of cough and shortness of breath in the setting of a recent COVID-19 infection on 06/05/2020 with hospitalization at the [place]. Upon discharge from the [place] patient was prescribed 3 L nasal cannula of oxygen to be worn around the clock at home.  Per wife's report patient had a repeat Covid test that was -2 weeks ago. Patient reported to the ED physician upon arrival that on the morning of 06/04/2020 he checked his SPO2 at home and it was 82% on 3 L and came to the ED to be evaluated.  He denied chest pain/recent fever. ED course: D-dimer elevated >> CTA negative for PE & troponins flat at 10 x2.  Due to concern for COPD exacerbation, patient received 2 duo nebs and a dose of Solu-Medrol.  TRH consulted for admission to PCU because of increasing oxygen requirements.  Patient on 5 L nasal cannula SPO2 in mid 90s.  At the time of hospitalist assessment patient appeared to have a altered mental status, different from initial presentation.  Additional work-up performed for acute encephalopathy with intermittent "jerks" causing concern for possible seizures activity including bilateral lower extremity ultrasound negative for DVT, consultation with neurology and Keppra administration.  ABG revealed PCO2 at 148, PCCM stat consulted for possible need of emergent intubation and mechanical ventilation due to obtunded  LOC.  Past Medical History:  COPD -3 L nasal cannula chronic O2 Hypertension Asbestos exposure Hyperlipidemia Obesity Former smoker  Significant Hospital Events:  06/26/2020-admitted by [facility], became obtunded while rooming in the ED, PCO2 148, requiring RSI and mechanical ventilation.  Admitted to the ICU  Consults:    Procedures:  05/31/2020 ETT >>  Significant Diagnostic Tests:  06/28/2020 CTa chest >> no acute findings, negative for PE, no evidence of pneumonia or pulmonary edema.  Pleural plaques bilaterally compatible with sequela of asbestos exposure.  Asymmetrically prominent pleural thickening in the right upper lobe.   06/26/2020 [place] BLE >> negative for DVT 06/24/2020 CT head >> sinusitis primarily on the right maxillary antrum, no other abnormality Micro Data:  06/11/2020 COVID-19 >> NEGATIVE  Antimicrobials:  06/26/2020 azithromycin >>  Interim History / Subjective:  No overnight events.  Poorly responsive even when sedation is lightened.   Objective   Blood pressure (!) 97/54, pulse 80, temperature 99 F (37.2 C), resp. rate 18, height '5\' 10"'  (1.778 m), weight 122.5 kg, SpO2 92 %.    Vent Mode: PRVC FiO2 (%):  [28 %-50 %] 28 % Set Rate:  [18 bmp-22 bmp] 18 bmp Vt Set:  [500 mL] 500 mL PEEP:  [5 cmH20] 5 cmH20 Plateau Pressure:  [21 cmH20] 21 cmH20  No intake or output data in the 24 hours ending 06/28/20 0831 Filed Weights   06/16/2020 0651  Weight: 122.5 kg    Examination: General: Adult male, intubated, mechanically ventilated, no asynchrony with the ventilator  HEENT: MM pink/moist, anicteric, atraumatic, neck supple Neuro: Unresponsive/RASS -5, unable to follow commands, PERRL +2  CV: s1s2 RRR, NSR on monitor, no r/m/g Pulm: Regular, abdominal muscle use, breath sounds diminished with expiratory wheezing-BUL & diminished-BLL GI: soft, rounded, bs x 4 Skin: Limited exam- no rashes/lesions noted Extremities: warm/dry, pulses + 2 R/P, no edema  noted  Resolved Hospital Problem list     Assessment & Plan:  Acute Hypoxic / Hypercapnic Respiratory Failure  Secondary to COPD exacerbation / in the setting of  previous COVID-19 infection (per wife COVID-19 + 06/05/2020)  PMHx: COPD, asbestos exposure CTa chest negative for pneumonia/pulmonary edema - Ventilator settings: PRVC  8 mL/kg, 50% FiO2, 5 PEEP, continue ventilator support & lung protective strategies - Adjusted rate, patient well compensated with PaCO2 between 50-60 with compensated pH - Wean PEEP & FiO2 as tolerated, maintain SpO2 > 90% - Head of bed elevated 30 degrees, VAP protocol in place - Plateau pressures less than 30 cm H20  - Intermittent chest x-ray & ABG PRN - Daily WUA with SBT as tolerated  - Ensure adequate pulmonary hygiene  - perform COVID-19 swab, no record of previous infection, utilize airborne and contact precautions as needed - PAD protocol in place: continue Fentanyl drip & Propofol drip -Continue scheduled DuoNebs & bronchodilators as needed -Continue azithromycin for total of 5 days -Continue methylprednisolone 60 mg twice daily -Continue vitamin C & zinc - Strict I/O's: alert provider if UOP < 0.5 mL/kg/hr -Daily CBC, monitor WBC/fever curve  Acute encephalopathy in the setting of respiratory acidosis PMHx: COPD Initial concern for possible seizure-like activity, low suspicion given how elevated PCO2 was on ABG (7.16/ >>120/108/52.8) - Repeat CT head - Daily BMP, replace electrolytes PRN - Neurochecks every 4 h  Hyperlipidemia Hypertension We will verify with wife home regimen. -Continue hydralazine PRN to maintain SBP less than 165  Best practice (evaluated daily)  Diet: N.p.o. Pain/Anxiety/Delirium protocol (if indicated): Fentanyl and propofol drips VAP protocol (if indicated): Initiated DVT prophylaxis: Enoxaparin GI prophylaxis: Pepcid IV Glucose control: Every 4 CBG monitor Mobility: Bedrest, mobilize as tolerated Disposition:  ICU  Goals of Care:  Last date of multidisciplinary goals of care discussion: 06/12/2020 Family and staff present: MD, APP, wife & daughter Summary of discussion: Discussed the need for rapid sequence intubation due to LOC and hypercapnia.  Discussed plan of care overnight, all questions and concerns answered. Follow up goals of care discussion due: 06/28/2020 Code Status: Full  Labs   CBC: Recent Labs  Lab 06/06/2020 0658 06/28/20 0412  WBC 10.5 7.5  NEUTROABS 5.1  --   HGB 13.5 11.6*  HCT 42.9 37.4*  MCV 93.1 92.6  PLT 241 119    Basic Metabolic Panel: Recent Labs  Lab 06/26/2020 0658 05/30/2020 0901 06/28/20 0412  NA 141  --  139  K 3.4*  --  3.6  CL 86*  --  90*  CO2 42*  --  36*  GLUCOSE 118*  --  122*  BUN 17  --  30*  CREATININE 1.05  --  1.46*  CALCIUM 9.6  --  9.1  MG  --  1.7 2.4  PHOS  --   --  1.2*   GFR: Estimated Creatinine Clearance: 56.5 mL/min (A) (by C-G formula based on SCr of 1.46 mg/dL (H)). Recent Labs  Lab 06/11/2020 0658 06/28/20 0412  WBC 10.5 7.5    Liver Function Tests: Recent Labs  Lab 06/28/2020 0658  AST 31  ALT 27  ALKPHOS 78  BILITOT 0.6  PROT 8.1  ALBUMIN 3.5   No results for input(s):  LIPASE, AMYLASE in the last 168 hours. No results for input(s): AMMONIA in the last 168 hours.  ABG    Component Value Date/Time   PHART 7.53 (H) 06/28/2020 0440   PCO2ART 55 (H) 06/28/2020 0440   PO2ART 74 (L) 06/28/2020 0440   HCO3 46.0 (H) 06/28/2020 0440   O2SAT 96.2 06/28/2020 0440     Coagulation Profile: Recent Labs  Lab 06/24/2020 0658  INR 1.0    Cardiac Enzymes: No results for input(s): CKTOTAL, CKMB, CKMBINDEX, TROPONINI in the last 168 hours.  HbA1C: No results found for: HGBA1C  CBG: Recent Labs  Lab 06/26/2020 2316 06/28/20 0329 06/28/20 0726  GLUCAP 151* 127* 142*    Review of Systems:   UTA-patient obtunded requiring rapid sequence intubation and mechanical ventilation.  Past Medical History:  He,  has a  past medical history of COPD (chronic obstructive pulmonary disease) ([place]) and Hypertension.   Surgical History:  History reviewed. No pertinent surgical history.   Social History:   reports that he has quit smoking. He has never used smokeless tobacco. He reports previous alcohol use. He reports that he does not use drugs.   Family History:  His family history includes Heart disease in his sister.   Allergies Allergies  Allergen Reactions  . Lisinopril   . Tamsulosin   . Tramadol      Current medications Scheduled Meds: . vitamin C  500 mg Per Tube Daily  . azithromycin  250 mg Per Tube Daily  . chlorhexidine gluconate (MEDLINE KIT)  15 mL Mouth Rinse BID  . Chlorhexidine Gluconate Cloth  6 each Topical Daily  . docusate  100 mg Per Tube BID  . enoxaparin (LOVENOX) injection  40 mg Subcutaneous Q24H  . ipratropium-albuterol  3 mL Nebulization Q4H  . mouth rinse  15 mL Mouth Rinse 10 times per day  . methylPREDNISolone (SOLU-MEDROL) injection  60 mg Intravenous Q12H  . polyethylene glycol  17 g Per Tube Daily  . zinc sulfate  220 mg Per Tube Daily   Continuous Infusions: . sodium chloride 50 mL/hr at 06/28/20 1600  . sodium chloride    . famotidine (PEPCID) IV Stopped (06/28/20 1107)  . fentaNYL infusion INTRAVENOUS 150 mcg/hr (06/28/20 1606)  . norepinephrine (LEVOPHED) Adult infusion Stopped (06/28/20 0827)  . propofol (DIPRIVAN) infusion 20 mcg/kg/min (06/28/20 1600)   PRN Meds:.acetaminophen, albuterol, fentaNYL, hydrALAZINE    Critical care time: 40 minutes    Patient's wife apprised at bedside, she will try to bring medication list for the patient from [place].   Renold Don, MD [facility]   *This note was dictated using voice recognition software/Dragon.  Despite best efforts to proofread, errors can occur which can change the meaning.  Any change was purely unintentional.

## 2020-06-28 NOTE — Progress Notes (Addendum)
   06/28/20 1545  Clinical Encounter Type  Visited With Patient and family together  Visit Type Initial;Spiritual support;Social support  Referral From Nurse  Consult/Referral To Chaplain  Spiritual Encounters  Spiritual Needs Prayer;Emotional  Chaplain Oleta Mouse responded to a page in ICU-09. Pt's wife requested to see a Chaplain. Pt's wife, Mrs. Hilda Lias stated, she just wanted me to pray for her husband, Kristopher Blake." I prayed for the Pt and his wife, and offered words of encouragement.

## 2020-06-28 NOTE — Consult Note (Signed)
PHARMACY CONSULT NOTE - FOLLOW UP  Pharmacy Consult for Electrolyte Monitoring and Replacement   Recent Labs: Potassium (mmol/L)  Date Value  06/28/2020 3.6   Magnesium (mg/dL)  Date Value  89/38/1017 2.4   Calcium (mg/dL)  Date Value  51/06/5850 9.1   Albumin (g/dL)  Date Value  77/82/4235 3.5   Phosphorus (mg/dL)  Date Value  36/14/4315 1.2 (L)   Sodium (mmol/L)  Date Value  06/28/2020 139     Assessment: 75 y/o male presented on 1/29 with altered mental status and SOB requiring emergency intubation in ED. Pharmacy has been consulted for electrolyte management.   Goal of Therapy:  electrolytes WNL  Plan:  Potassium phos 45 mmol IV x 1 ordered for low phosphorus this morning. Currently infusing. No further replacement indicated at this time. Follow up with morning labs.  Pricilla Riffle ,PharmD Clinical Pharmacist 06/28/2020 7:42 AM

## 2020-06-28 NOTE — Progress Notes (Signed)
OT Cancellation Note  Patient Details Name: Kristopher Blake MRN: [id number] DOB: [birth year]   Cancelled Treatment:    Reason Eval/Treat Not Completed: Patient not medically ready. OT order received, chart reviewed. Pt currently intubated.  Per discussion with pt's nurse, therapy will sign off at this time (pt intubated/not medically ready to initiate therapy evaluations). Please re-consult OT when pt is medically appropriate.   Kathie Dike, M.S. OTR/L  06/28/20, 10:25 AM  ascom [phone]

## 2020-06-28 NOTE — Progress Notes (Signed)
PT Cancellation Note  Patient Details Name: [name] MRN: [id number] DOB: 03/18/1946   Cancelled Treatment:    Reason Eval/Treat Not Completed: Patient not medically ready.  PT consult received.  Chart reviewed.  Pt currently intubated.  Per discussion with pt's nurse, therapy will sign off at this time (pt intubated/not medically ready to initiate therapy evaluations).  Please re-consult PT when pt is medically appropriate to participate in therapy.  Hendricks Limes, PT 06/28/20, 10:24 AM

## 2020-06-29 DIAGNOSIS — J9621 Acute and chronic respiratory failure with hypoxia: Secondary | ICD-10-CM | POA: Diagnosis not present

## 2020-06-29 DIAGNOSIS — G9341 Metabolic encephalopathy: Secondary | ICD-10-CM | POA: Diagnosis not present

## 2020-06-29 DIAGNOSIS — J9601 Acute respiratory failure with hypoxia: Secondary | ICD-10-CM | POA: Diagnosis not present

## 2020-06-29 DIAGNOSIS — J441 Chronic obstructive pulmonary disease with (acute) exacerbation: Secondary | ICD-10-CM | POA: Diagnosis not present

## 2020-06-29 LAB — BASIC METABOLIC PANEL
Anion gap: 16 — ABNORMAL HIGH (ref 5–15)
BUN: 51 mg/dL — ABNORMAL HIGH (ref 8–23)
CO2: 34 mmol/L — ABNORMAL HIGH (ref 22–32)
Calcium: 8.3 mg/dL — ABNORMAL LOW (ref 8.9–10.3)
Chloride: 88 mmol/L — ABNORMAL LOW (ref 98–111)
Creatinine, Ser: 2.72 mg/dL — ABNORMAL HIGH (ref 0.61–1.24)
GFR, Estimated: 23 mL/min — ABNORMAL LOW (ref >60)
Glucose, Bld: 158 mg/dL — ABNORMAL HIGH (ref 70–99)
Potassium: 3.7 mmol/L (ref 3.5–5.1)
Sodium: 138 mmol/L (ref 135–145)

## 2020-06-29 LAB — GLUCOSE, CAPILLARY
Glucose-Capillary: 115 mg/dL — ABNORMAL HIGH (ref 70–99)
Glucose-Capillary: 129 mg/dL — ABNORMAL HIGH (ref 70–99)
Glucose-Capillary: 134 mg/dL — ABNORMAL HIGH (ref 70–99)
Glucose-Capillary: 137 mg/dL — ABNORMAL HIGH (ref 70–99)
Glucose-Capillary: 138 mg/dL — ABNORMAL HIGH (ref 70–99)
Glucose-Capillary: 152 mg/dL — ABNORMAL HIGH (ref 70–99)
Glucose-Capillary: 157 mg/dL — ABNORMAL HIGH (ref 70–99)

## 2020-06-29 LAB — CBC
HCT: 37.4 % — ABNORMAL LOW (ref 39.0–52.0)
Hemoglobin: 11.5 g/dL — ABNORMAL LOW (ref 13.0–17.0)
MCH: 29 pg (ref 26.0–34.0)
MCHC: 30.7 g/dL (ref 30.0–36.0)
MCV: 94.2 fL (ref 80.0–100.0)
Platelets: 194 10*3/uL (ref 150–400)
RBC: 3.97 MIL/uL — ABNORMAL LOW (ref 4.22–5.81)
RDW: 13.5 % (ref 11.5–15.5)
WBC: 8.8 10*3/uL (ref 4.0–10.5)
nRBC: 0 % (ref 0.0–0.2)

## 2020-06-29 LAB — PHOSPHORUS
Phosphorus: 7.5 mg/dL — ABNORMAL HIGH (ref 2.5–4.6)
Phosphorus: 5.9 mg/dL — ABNORMAL HIGH (ref 2.5–4.6)

## 2020-06-29 LAB — MAGNESIUM
Magnesium: 2.3 mg/dL (ref 1.7–2.4)
Magnesium: 2.3 mg/dL (ref 1.7–2.4)

## 2020-06-29 LAB — PATHOLOGIST SMEAR REVIEW

## 2020-06-29 MED ORDER — VITAL 1.5 CAL PO LIQD
1000.0000 mL | ORAL | Status: DC
  Administered 2020-06-29 – 2020-07-04 (×4): 1000 mL

## 2020-06-29 MED ORDER — FAMOTIDINE 20 MG PO TABS
20.0000 mg | ORAL_TABLET | Freq: Every day | ORAL | Status: DC
  Administered 2020-06-30 – 2020-07-09 (×10): 20 mg
  Filled 2020-06-29 (×11): qty 1

## 2020-06-29 MED ORDER — PROSOURCE TF PO LIQD
90.0000 mL | Freq: Three times a day (TID) | ORAL | Status: DC
  Administered 2020-06-29 – 2020-07-06 (×21): 90 mL
  Filled 2020-06-29 (×23): qty 90

## 2020-06-29 MED ORDER — VITAL HIGH PROTEIN PO LIQD: 1000.0000 mL | ORAL | Status: DC

## 2020-06-29 NOTE — Progress Notes (Signed)
Per lab, creatinine is critically high at 2.72, notified NP on duty.

## 2020-06-29 NOTE — Progress Notes (Signed)
Stable on vent settings and infusions. No significant changes to care plan today. Wife at the bedside for most of the day. Wake up assessments to begin tomorrow.

## 2020-06-29 NOTE — Progress Notes (Signed)
GOALS OF CARE DISCUSSION  The Clinical status was relayed to family in detail. Wife at bedside  Updated and notified of patients medical condition.  Patient remains unresponsive and will not open eyes to command.     Patient with increased WOB and using accessory muscles to breathe Explained to family course of therapy and the modalities     Patient with Progressive multiorgan failure with a very high probablity of a very minimal chance of meaningful recovery despite all aggressive and optimal medical therapy.   Family understands the situation.  Patient remains Full Code  Family are satisfied with Plan of action and management. All questions answered  Additional CC time 32 mins   Morrissa Shein Santiago Glad, M.D.  [facility]  Medical Director [facility] Medical Director [facility] Cardio-Pulmonary Department

## 2020-06-29 NOTE — Progress Notes (Signed)
Initial Nutrition Assessment  DOCUMENTATION CODES:   Obesity unspecified  INTERVENTION:  Initiate Vital 1.5 Cal at 20 mL/hr and advance by 15 mL/hr every 12 hours to goal rate of 50 mL/hr per tube (1200 mL goal daily volume). Also provide PROSource TF 90 mL TID per tube. Goal regimen provides 2040 kcal, 147 grams of protein, 912 mL H2O daily. With current propofol rate provides 2623 kcal daily.  Monitor magnesium, potassium, and phosphorus daily for at least 3 days, MD to replete as needed, as pt is at risk for refeeding syndrome.  NUTRITION DIAGNOSIS:   Inadequate oral intake related to inability to eat as evidenced by NPO status.  GOAL:   Patient will meet greater than or equal to 90% of their needs  MONITOR:   Vent status,Labs,Weight trends,TF tolerance,I & O's  REASON FOR ASSESSMENT:   Ventilator,Consult Assessment of nutrition requirement/status,Enteral/tube feeding initiation and management  ASSESSMENT:   75 year old male with PMHx of COPD, HTN, recent COVID-19 06/05/2020 admitted with AECOPD, acute encephalopathy.   1/29 intubated  Patient is currently intubated on ventilator support MV: 6.6 L/min Temp (24hrs), Avg:98.2 F (36.8 C), Min:98.06 F (36.7 C), Max:98.4 F (36.9 C)  Propofol: 22.1 ml/hr (583 kcal daily)  Medications reviewed and include: vitamin C 500 mg daily, azithromycin, Colace 100 mg BID, Solu-Medrol 60 mg Q12hrs IV, Miralax 17 grams daily, zinc sulfate 220 mg daily, famotidine, NS at 50 mL/hr, fentanyl gtt, propofol gtt.  Labs reviewed: CBG 129-157, Chloride 88, CO2 34, BUN 51, Creatinine 2.72, Phosphorus 7.5.  I/O: 700 mL UOP yesterday (0.2 mL/kg/hr)  Weight trend: 122.5 kg (270 lbs) per documentation on 1/29 but unsure if this is truly measured; per wife at bedside pt likely lost weight with COVID but she is unsure of specific amount lost or UBW  Enteral Access: 16 Fr. OGT placed 1/29; terminates in stomach per abdominal x-ray  1/30  Discussed on rounds. Plan is to start tube feeds today.  NUTRITION - FOCUSED PHYSICAL EXAM:  Flowsheet Row Most Recent Value  Orbital Region No depletion  Upper Arm Region No depletion  Thoracic and Lumbar Region No depletion  Buccal Region Unable to assess  Temple Region No depletion  Clavicle Bone Region No depletion  Clavicle and Acromion Bone Region No depletion  Scapular Bone Region Unable to assess  Dorsal Hand No depletion  Patellar Region No depletion  Anterior Thigh Region No depletion  Posterior Calf Region No depletion  Edema (RD Assessment) --  [non pitting]  Hair Reviewed  Eyes Unable to assess  Mouth Unable to assess  Skin Reviewed  Nails Reviewed     Diet Order:   Diet Order            Diet NPO time specified  Diet effective now                EDUCATION NEEDS:   No education needs have been identified at this time  Skin:  Skin Assessment: Reviewed RN Assessment  Last BM:  Unknown/PTA  Height:   Ht Readings from Last 1 Encounters:  July 21, 2020 5\' 10"  (1.778 m)   Weight:   Wt Readings from Last 1 Encounters:  07/21/20 122.5 kg   Ideal Body Weight:  75.5 kg  BMI:  Body mass index is 38.74 kg/m.  Estimated Nutritional Needs:   Kcal:  1900-2100  Protein:  151 grams  Fluid:  >/= 2 L/day  06/29/20, MS, RD, LDN Pager number available on Amion

## 2020-06-29 NOTE — Procedures (Signed)
Patient Name: Elric R [place]  MRN: [id number]  Epilepsy Attending: Charlsie Quest  Referring Physician/Provider: Dr Lorretta Harp Date: 06/29/2020 Duration: 22.48 mins  Patient history: 75yo M with ams. EEG to evaluate for seizure  Level of alertness:  comatose  AEDs during EEG study: Propofol  Technical aspects: This EEG study was done with scalp electrodes positioned according to the 10-20 International system of electrode placement. Electrical activity was acquired at a sampling rate of 500Hz  and reviewed with a high frequency filter of 70Hz  and a low frequency filter of 1Hz . EEG data were recorded continuously and digitally stored.   Description: EEG showed continuou generalized 3 to 6 Hz theta-delta slowing. Generalized periodic discharges with triphasic morphology at 1Hz  were also noted. Hyperventilation and photic stimulation were not performed.     ABNORMALITY -Continuous slow, generalized -Periodic discharges with triphasic morphology, generalized  IMPRESSION: This study is suggestive of severe diffuse encephalopathy, nonspecific etiology. Periodic discharges with triphasic morphology were noted which are on the ictal-interictal continuum. No seizures  were seen throughout the recording.  Kacin Dancy 

## 2020-06-29 NOTE — Progress Notes (Signed)
Assisted tele visit to patient with daughter. ° °Donne Robillard P, RN  °

## 2020-06-29 NOTE — Progress Notes (Signed)
CRITICAL CARE NOTE 75 year old male post COVID-6 infection 06/05/2020 presenting to the ED with 2 days of cough and shortness of breath at home after discharge from the [place]. patient developed acute hypercapnic respiratory failure, becoming obtunded requiring rapid sequence intubation and mechanical ventilation admitted to ICU.  Recent COVID-19 infection on 06/05/2020 with hospitalization at the [place].  Upon discharge from the [place] patient was prescribed 3 L nasal cannula of oxygen to be worn around the clock at home.  Per wife's report patient had a repeat Covid test that was -2 weeks ago.   Patient reported to the ED physician upon arrival that on the morning of 06/19/2020 he checked his SPO2 at home and it was 82% on 3 L and came to the ED to be evaluated.  He denied chest pain/recent fever.  ED course: D-dimer elevated >> CTA negative for PE & troponins flat at 10 x2.  Due to concern for COPD exacerbation, patient received 2 duo nebs and a dose of Solu-Medrol.    Additional work-up performed for acute encephalopathy with intermittent "jerks" causing concern for possible seizures activity including bilateral lower extremity ultrasound negative for DVT, consultation with neurology and Keppra administration.  ABG revealed PCO2 at 148, PCCM stat consulted for possible need of emergent intubation and mechanical ventilation due to obtunded LOC.  Past Medical History:  COPD -3 L nasal cannula chronic O2 Hypertension Asbestos exposure Hyperlipidemia Obesity Former smoker  Significant Hospital Events:  06/22/2020-admitted by [facility], became obtunded while rooming in the ED, PCO2 148, requiring RSI and mechanical ventilation.  Admitted to the ICU 1/31 severe resp failure   Procedures:  06/25/2020 ETT >>  Significant Diagnostic Tests:  06/10/2020 CTa chest >> no acute findings, negative for PE, no evidence of pneumonia or pulmonary edema.  Pleural plaques bilaterally compatible with sequela of asbestos  exposure.  Asymmetrically prominent pleural thickening in the right upper lobe.   06/01/2020 [place] BLE >> negative for DVT 06/17/2020 CT head >> sinusitis primarily on the right maxillary antrum, no other abnormality Micro Data:  06/05/2020 COVID-19 >> NEGATIVE  Antimicrobials:  06/24/2020 azithromycin >>      CC  follow up respiratory failure  SUBJECTIVE Patient remains critically ill Prognosis is guarded   Vent Mode: PRVC FiO2 (%):  [35 %-45 %] 40 % Set Rate:  [14 bmp] 14 bmp Vt Set:  [500 mL] 500 mL PEEP:  [5 cmH20] 5 cmH20 Plateau Pressure:  [17 cmH20-21 cmH20] 20 cmH20   CBC    Component Value Date/Time   WBC 8.8 06/29/2020 0446   RBC 3.97 (L) 06/29/2020 0446   HGB 11.5 (L) 06/29/2020 0446   HCT 37.4 (L) 06/29/2020 0446   PLT 194 06/29/2020 0446   MCV 94.2 06/29/2020 0446   MCH 29.0 06/29/2020 0446   MCHC 30.7 06/29/2020 0446   RDW 13.5 06/29/2020 0446   LYMPHSABS 1.2 06/07/2020 0658   MONOABS 0.9 06/02/2020 0658   EOSABS 3.2 (H) 06/19/2020 0658   BASOSABS 0.1 06/02/2020 0658   BMP Latest Ref Rng & Units 06/29/2020 06/28/2020 06/06/2020  Glucose 70 - 99 mg/dL 158(H) 122(H) 118(H)  BUN 8 - 23 mg/dL 51(H) 30(H) 17  Creatinine 0.61 - 1.24 mg/dL 2.72(H) 1.46(H) 1.05  Sodium 135 - 145 mmol/L 138 139 141  Potassium 3.5 - 5.1 mmol/L 3.7 3.6 3.4(L)  Chloride 98 - 111 mmol/L 88(L) 90(L) 86(L)  CO2 22 - 32 mmol/L 34(H) 36(H) 42(H)  Calcium 8.9 - 10.3 mg/dL 8.3(L) 9.1 9.6  BP (!) 111/56   Pulse 89   Temp 98.2 F (36.8 C)   Resp 14   Ht '5\' 10"'  (1.778 m)   Wt 122.5 kg   SpO2 94%   BMI 38.74 kg/m    I/O last 3 completed shifts: In: 2977 [I.V.:2151.9; NG/GT:60; IV Piggyback:765] Out: 700 [Urine:700] No intake/output data recorded.  SpO2: 94 % O2 Flow Rate (L/min): 6 L/min FiO2 (%): 40 %  Estimated body mass index is 38.74 kg/m as calculated from the following:   Height as of this encounter: '5\' 10"'  (1.778 m).   Weight as of this encounter: 122.5  kg.  SIGNIFICANT EVENTS   REVIEW OF SYSTEMS  PATIENT IS UNABLE TO PROVIDE COMPLETE REVIEW OF SYSTEMS DUE TO SEVERE CRITICAL ILLNESS    PHYSICAL EXAMINATION:  GENERAL:critically ill appearing, +resp distress HEAD: Normocephalic, atraumatic.  EYES: Pupils equal, round, reactive to light.  No scleral icterus.  MOUTH: Moist mucosal membrane. NECK: Supple.  PULMONARY: +rhonchi, +wheezing CARDIOVASCULAR: S1 and S2. Regular rate and rhythm. No murmurs, rubs, or gallops.  GASTROINTESTINAL: Soft, nontender, -distended.  Positive bowel sounds.   MUSCULOSKELETAL: No swelling, clubbing, or edema.  NEUROLOGIC: obtunded, GCS<8 SKIN:intact,warm,dry  MEDICATIONS: I have reviewed all medications and confirmed regimen as documented   CULTURE RESULTS   Recent Results (from the past 240 hour(s))  SARS Coronavirus 2 by RT PCR (hospital order, performed in [facility] hospital lab) Nasopharyngeal Nasopharyngeal Swab     Status: None   Collection Time: 06/25/2020  9:36 PM   Specimen: Nasopharyngeal Swab  Result Value Ref Range Status   SARS Coronavirus 2 NEGATIVE NEGATIVE Final    Comment: (NOTE) SARS-CoV-2 target nucleic acids are NOT DETECTED.  The SARS-CoV-2 RNA is generally detectable in upper and lower respiratory specimens during the acute phase of infection. The lowest concentration of SARS-CoV-2 viral copies this assay can detect is 250 copies / mL. A negative result does not preclude SARS-CoV-2 infection and should not be used as the sole basis for treatment or other patient management decisions.  A negative result may occur with improper specimen collection / handling, submission of specimen other than nasopharyngeal swab, presence of viral mutation(s) within the areas targeted by this assay, and inadequate number of viral copies (<250 copies / mL). A negative result must be combined with clinical observations, patient history, and epidemiological information.  Fact Sheet for  Patients:   StrictlyIdeas.no  Fact Sheet for Healthcare Providers: BankingDealers.co.za  This test is not yet approved or  cleared by the [place] FDA and has been authorized for detection and/or diagnosis of SARS-CoV-2 by FDA under an Emergency Use Authorization (EUA).  This EUA will remain in effect (meaning this test can be used) for the duration of the COVID-19 declaration under Section 564(b)(1) of the Act, 21 U.S.C. section 360bbb-3(b)(1), unless the authorization is terminated or revoked sooner.  Performed at [facility], [address] [place], [place] [address]   MRSA PCR Screening     Status: None   Collection Time: 06/14/2020 11:39 PM   Specimen: Nasopharyngeal  Result Value Ref Range Status   MRSA by PCR NEGATIVE NEGATIVE Final    Comment:        The GeneXpert MRSA Assay (FDA approved for NASAL specimens only), is one component of a comprehensive MRSA colonization surveillance program. It is not intended to diagnose MRSA infection nor to guide or monitor treatment for MRSA infections. Performed at [facility], [address], [place], [place] [address]  IMAGING    CT HEAD WO CONTRAST  Result Date: 06/28/2020 CLINICAL DATA:  Shortness of breath, shaking. EXAM: CT HEAD WITHOUT CONTRAST TECHNIQUE: Contiguous axial images were obtained from the base of the skull through the vertex without intravenous contrast. COMPARISON:  Head CT dated 06/02/2020. FINDINGS: Brain: Ventricles are within normal limits in size and configuration. There is no mass, hemorrhage, edema or other evidence of acute parenchymal abnormality. No extra-axial hemorrhage. Vascular: Chronic calcified atherosclerotic changes of the large vessels at the skull base. No unexpected hyperdense vessel. Skull: Normal. Negative for fracture or focal lesion. Sinuses/Orbits: Again noted is fluid/mucosal thickening within the  visualized portion of the RIGHT maxillary sinus. Additional mucosal thickening and/or fluid throughout the ethmoid air cells and lower portion of the LEFT frontal sinus, of uncertain chronicity. Periorbital and retro-orbital soft tissues are unremarkable. Other: None. IMPRESSION: 1. No acute intracranial abnormality. No intracranial mass, hemorrhage or edema. 2. Paranasal sinus disease, of uncertain chronicity. Electronically Signed   By: Franki Cabot M.D.   On: 06/28/2020 10:16        Indwelling Urinary Catheter continued, requirement due to   Reason to continue Indwelling Urinary Catheter strict Intake/Output monitoring for hemodynamic instability         Ventilator continued, requirement due to severe respiratory failure   Ventilator Sedation RASS 0 to -2      ASSESSMENT AND PLAN SYNOPSIS   Severe ACUTE Hypoxic and Hypercapnic Respiratory Failure due to severe COPD exacerbation with previous COVID 19 infection and morbid obesity   Severe ACUTE Hypoxic and Hypercapnic Respiratory Failure -continue Mechanical Ventilator support -continue Bronchodilator Therapy -Wean Fio2 and PEEP as tolerated -VAP/VENT bundle implementation -will perform SAT/SBT when respiratory parameters are met  ACUTE DIASTOLIC CARDIAC FAILURE-  -oxygen as needed -Lasix as tolerated   Morbid obesity, possible OSA.   Will certainly impact respiratory mechanics, ventilator weaning Suspect will need to consider additional PEEP   ACUTE KIDNEY INJURY/Renal Failure -continue Foley Catheter-assess need -Avoid nephrotoxic agents -Follow urine output, BMP -Ensure adequate renal perfusion, optimize oxygenation -Renal dose medications     NEUROLOGY Acute toxic metabolic encephalopathy, need for sedation Goal RASS -2 to -3 Assess neuro status Consider NEUROLOGY CONSULTATION  CARDIAC ICU monitoring  ID -continue IV abx as prescibed -follow up cultures  GI GI PROPHYLAXIS as  indicated  DIET-->TF's as tolerated Constipation protocol as indicated  ENDO - will use ICU hypoglycemic\Hyperglycemia protocol if indicated     ELECTROLYTES -follow labs as needed -replace as needed -pharmacy consultation and following   DVT/GI PRX ordered and assessed TRANSFUSIONS AS NEEDED MONITOR FSBS I Assessed the need for Labs I Assessed the need for Foley I Assessed the need for Central Venous Line Family Discussion when available I Assessed the need for Mobilization I made an Assessment of medications to be adjusted accordingly Safety Risk assessment completed   CASE DISCUSSED IN MULTIDISCIPLINARY ROUNDS WITH ICU TEAM  Critical Care Time devoted to patient care services described in this note is 45  minutes.   Overall, patient is critically ill, prognosis is guarded.     Corrin Parker, M.D.  [facility]  Medical Director [place] Director [facility] Cardio-Pulmonary Department

## 2020-06-29 NOTE — Consult Note (Signed)
PHARMACY CONSULT NOTE  Pharmacy Consult for Electrolyte Monitoring and Replacement   Recent Labs: Potassium (mmol/L)  Date Value  06/29/2020 3.7   Magnesium (mg/dL)  Date Value  49/70/2637 2.3   Calcium (mg/dL)  Date Value  85/88/5027 8.3 (L)   Albumin (g/dL)  Date Value  74/04/8785 3.5   Phosphorus (mg/dL)  Date Value  76/72/0947 7.5 (H)   Sodium (mmol/L)  Date Value  06/29/2020 138     Assessment: 75 y/o male presented on 1/29 with altered mental status and SOB requiring emergency intubation in ED. Pharmacy has been consulted for electrolyte management.  Patient with worsening renal function (Scr 1.05 >> 1.46 >> 2.72). On MIVF with NS at 50 mL/hr.   Goal of Therapy:  Electrolytes WNL  Plan:  --No electrolyte replacement warranted today --Follow-up electrolytes with AM labs tomorrow  Tressie Ellis 06/29/2020 8:28 AM

## 2020-06-29 NOTE — Progress Notes (Signed)
eeg done °

## 2020-06-30 ENCOUNTER — Ambulatory Visit: Payer: Medicare Other | Admitting: Podiatry

## 2020-06-30 DIAGNOSIS — U071 COVID-19: Secondary | ICD-10-CM

## 2020-06-30 DIAGNOSIS — Z7189 Other specified counseling: Secondary | ICD-10-CM | POA: Diagnosis not present

## 2020-06-30 DIAGNOSIS — Z515 Encounter for palliative care: Secondary | ICD-10-CM

## 2020-06-30 DIAGNOSIS — J9601 Acute respiratory failure with hypoxia: Secondary | ICD-10-CM | POA: Diagnosis not present

## 2020-06-30 DIAGNOSIS — J9621 Acute and chronic respiratory failure with hypoxia: Secondary | ICD-10-CM | POA: Diagnosis not present

## 2020-06-30 DIAGNOSIS — J441 Chronic obstructive pulmonary disease with (acute) exacerbation: Secondary | ICD-10-CM | POA: Diagnosis not present

## 2020-06-30 DIAGNOSIS — G9341 Metabolic encephalopathy: Secondary | ICD-10-CM | POA: Diagnosis not present

## 2020-06-30 LAB — GLUCOSE, CAPILLARY
Glucose-Capillary: 152 mg/dL — ABNORMAL HIGH (ref 70–99)
Glucose-Capillary: 144 mg/dL — ABNORMAL HIGH (ref 70–99)
Glucose-Capillary: 123 mg/dL — ABNORMAL HIGH (ref 70–99)
Glucose-Capillary: 130 mg/dL — ABNORMAL HIGH (ref 70–99)
Glucose-Capillary: 131 mg/dL — ABNORMAL HIGH (ref 70–99)
Glucose-Capillary: 127 mg/dL — ABNORMAL HIGH (ref 70–99)

## 2020-06-30 LAB — BASIC METABOLIC PANEL
Anion gap: 12 (ref 5–15)
BUN: 65 mg/dL — ABNORMAL HIGH (ref 8–23)
CO2: 35 mmol/L — ABNORMAL HIGH (ref 22–32)
Calcium: 8.2 mg/dL — ABNORMAL LOW (ref 8.9–10.3)
Chloride: 92 mmol/L — ABNORMAL LOW (ref 98–111)
Creatinine, Ser: 2.07 mg/dL — ABNORMAL HIGH (ref 0.61–1.24)
GFR, Estimated: 33 mL/min — ABNORMAL LOW (ref >60)
Glucose, Bld: 149 mg/dL — ABNORMAL HIGH (ref 70–99)
Potassium: 3.8 mmol/L (ref 3.5–5.1)
Sodium: 139 mmol/L (ref 135–145)

## 2020-06-30 LAB — MAGNESIUM
Magnesium: 2.5 mg/dL — ABNORMAL HIGH (ref 1.7–2.4)
Magnesium: 2.7 mg/dL — ABNORMAL HIGH (ref 1.7–2.4)

## 2020-06-30 LAB — PHOSPHORUS
Phosphorus: 5 mg/dL — ABNORMAL HIGH (ref 2.5–4.6)
Phosphorus: 4 mg/dL (ref 2.5–4.6)

## 2020-06-30 MED ORDER — METHYLPREDNISOLONE SODIUM SUCC 40 MG IJ SOLR
40.0000 mg | Freq: Two times a day (BID) | INTRAMUSCULAR | Status: DC
  Administered 2020-06-30 – 2020-07-01 (×4): 40 mg via INTRAVENOUS
  Filled 2020-06-30 (×4): qty 1

## 2020-06-30 NOTE — Consult Note (Signed)
PHARMACY CONSULT NOTE  Pharmacy Consult for Electrolyte Monitoring and Replacement   Recent Labs: Potassium (mmol/L)  Date Value  06/30/2020 3.8   Magnesium (mg/dL)  Date Value  05/69/7948 2.5 (H)   Calcium (mg/dL)  Date Value  01/65/5374 8.2 (L)   Albumin (g/dL)  Date Value  82/70/7867 3.5   Phosphorus (mg/dL)  Date Value  54/49/2010 5.0 (H)   Sodium (mmol/L)  Date Value  06/30/2020 139     Assessment: 75 y/o male presented on 1/29 with altered mental status and SOB requiring emergency intubation in ED. Pharmacy has been consulted for electrolyte management.  Patient with worsening renal function (Scr 1.05 >> 1.46 >> 2.72).  Goal of Therapy:  Electrolytes WNL  Plan:  --No electrolyte replacement warranted today --Follow-up electrolytes with AM labs tomorrow  Pricilla Riffle, PharmD 06/30/2020 1:00 PM

## 2020-06-30 NOTE — Progress Notes (Signed)
Assisted tele visit to patient with family member.  Yuriel Lopezmartinez P, RN  

## 2020-06-30 NOTE — Progress Notes (Signed)
GOALS OF CARE DISCUSSION  The Clinical status was relayed to family in detail. Wife at Bedside  Updated and notified of patients medical condition.  Patient remains unresponsive and will not open eyes to command.      Patient with increased WOB and using accessory muscles to breathe Explained to family course of therapy and the modalities     Patient with Progressive multiorgan failure with a very high probablity of a very minimal chance of meaningful recovery despite all aggressive and optimal medical therapy.  Process associated with Suffering.  Family understands the situation.  Patient remains FULL CODE  Family are satisfied with Plan of action and management. All questions answered  Additional CC time 32 mins   Kyon Bentler Santiago Glad, M.D.  [facility]  Medical Director [facility] Medical Director [facility] Cardio-Pulmonary Department

## 2020-06-30 NOTE — Consult Note (Cosign Needed Addendum)
Consultation Note Date: 06/30/2020   Patient Name: Kristopher Blake  DOB: 1945/08/20  MRN: [id number]  Age / Sex: 75 y.o., male  PCP: Patient, No Pcp Per Referring Physician: Flora Lipps, MD  Reason for Consultation: Establishing goals of care  HPI/Patient Profile: Kristopher Blake is a 75 y.o. male with medical history significant of hypertension, hyperlipidemia, COPD on 3 L oxygen, former smoker, obesity, who presents with shortness breath and AMS.   Clinical Assessment and Goals of Care: Patient is resting in bed with ventilator in place.  Wife is at bedside.  She discusses that prior to a month ago her husband was active and independent.  She discusses his hospitalization due to Covid.  During the time that he has been at home since that admission she states that he has slept a lot and has had a poor appetite.  She states she tries to encourage him to get out of bed, and that when he is awake and out of bed, he goes to sit in his recliner and then falls asleep in the recliner.  She discusses deaths of 2 other family members within the past month.  Wife states they have a daughter and his son.  She tells me that her daughter has been deeply impacted by not being able to see her father due to visitor restrictions and wife's need to be at bedside.  Discussed the ability for E link visit, and wife is very grateful.  Shortly after E link nurse came on to round on patient, and was then notified of the daughter's telephone number and desire to have an E link visit.  Meeting ended when daughter came on.    SUMMARY OF RECOMMENDATIONS   Palliative will continue to follow.  Prognosis:   < 6 months      Primary Diagnoses: Present on Admission: . COPD exacerbation ([place]) . Acute on chronic respiratory failure with hypoxia ([place]) . HLD (hyperlipidemia) . HTN (hypertension) . COVID-19 virus infection .  Hypokalemia . Acute metabolic encephalopathy . Acute respiratory failure ([place])   I have reviewed the medical record, interviewed the patient and family, and examined the patient. The following aspects are pertinent.  Past Medical History:  Diagnosis Date  . COPD (chronic obstructive pulmonary disease) ([place])   . Hypertension    Social History   Socioeconomic History  . Marital status: Married    Spouse name: Not on file  . Number of children: Not on file  . Years of education: Not on file  . Highest education level: Not on file  Occupational History  . Not on file  Tobacco Use  . Smoking status: Former Research scientist (life sciences)  . Smokeless tobacco: Never Used  Substance and Sexual Activity  . Alcohol use: Not Currently  . Drug use: Never  . Sexual activity: Not on file  Other Topics Concern  . Not on file  Social History Narrative  . Not on file   Social Determinants of Health   Financial Resource Strain: Not on file  Food  Insecurity: Not on file  Transportation Needs: Not on file  Physical Activity: Not on file  Stress: Not on file  Social Connections: Not on file   Family History  Problem Relation Age of Onset  . Heart disease Sister    Scheduled Meds: . vitamin C  500 mg Per Tube Daily  . azithromycin  250 mg Per Tube Daily  . chlorhexidine gluconate (MEDLINE KIT)  15 mL Mouth Rinse BID  . Chlorhexidine Gluconate Cloth  6 each Topical Daily  . docusate  100 mg Per Tube BID  . enoxaparin (LOVENOX) injection  40 mg Subcutaneous Q24H  . famotidine  20 mg Per Tube Daily  . feeding supplement (PROSource TF)  90 mL Per Tube TID  . ipratropium-albuterol  3 mL Nebulization Q4H  . mouth rinse  15 mL Mouth Rinse 10 times per day  . methylPREDNISolone (SOLU-MEDROL) injection  60 mg Intravenous Q12H  . polyethylene glycol  17 g Per Tube Daily  . zinc sulfate  220 mg Per Tube Daily   Continuous Infusions: . sodium chloride 50 mL/hr at 06/29/20 1800  . sodium chloride    . feeding  supplement (VITAL 1.5 CAL) 1,000 mL (06/29/20 1956)  . fentaNYL infusion INTRAVENOUS 150 mcg/hr (06/30/20 0301)  . propofol (DIPRIVAN) infusion 40 mcg/kg/min (06/30/20 0704)   PRN Meds:.acetaminophen, albuterol, fentaNYL, hydrALAZINE Medications Prior to Admission:  Prior to Admission medications   Medication Sig Start Date End Date Taking? Authorizing Provider  albuterol (VENTOLIN HFA) 108 (90 Base) MCG/ACT inhaler Inhale 1-2 puffs into the lungs every 6 (six) hours as needed for wheezing or shortness of breath.   Yes [provider]  allopurinol (ZYLOPRIM) 300 MG tablet Take 300 mg by mouth daily.   Yes [provider]  colchicine 0.6 MG tablet Take 0.6 mg by mouth as needed (gout flare).   Yes [provider]  gabapentin (NEURONTIN) 300 MG capsule Take 300 mg by mouth 2 (two) times daily.   Yes [provider]  methocarbamol (ROBAXIN) 500 MG tablet Take 500 mg by mouth every 6 (six) hours as needed for muscle spasms.   Yes [provider]  sildenafil (VIAGRA) 50 MG tablet Take 50 mg by mouth daily as needed for erectile dysfunction.   Yes [provider]  aspirin EC 81 MG tablet Take 81 mg by mouth daily.    [provider]  atorvastatin (LIPITOR) 80 MG tablet Take 80 mg by mouth daily.    [provider]  camphor-menthol Timoteo Ace) lotion Apply 1 application topically as needed for itching.    [provider]  indapamide (LOZOL) 2.5 MG tablet Take 5 mg by mouth daily.    [provider]  oxyCODONE (OXY IR/ROXICODONE) 5 MG immediate release tablet Take by mouth. Patient not taking: No sig reported    [provider]  sildenafil (REVATIO) 2.5 mg/mL SUSP Take 20 mg by mouth 3 (three) times daily.    [provider]  sulindac (CLINORIL) 200 MG tablet Take by mouth.    [provider]  valsartan (DIOVAN) 320 MG tablet Take by mouth.    [provider]   Allergies   Allergen Reactions  . Lisinopril   . Tamsulosin   . Tramadol    Review of Systems  Unable to perform ROS   Physical Exam Constitutional:      Comments: Eyes closed. On ventilator.      Vital Signs: BP (!) 116/59   Pulse 71  Temp 97.7 F (36.5 C)   Resp 14   Ht '5\' 10"'  (1.778 m)   Wt 122.5 kg   SpO2 95%   BMI 38.74 kg/m  Pain Scale: CPOT   Pain Score: 2    SpO2: SpO2: 95 % O2 Device:SpO2: 95 % O2 Flow Rate: .O2 Flow Rate (L/min): 6 L/min  IO: Intake/output summary:   Intake/Output Summary (Last 24 hours) at 06/30/2020 2800 Last data filed at 06/30/2020 0800 Gross per 24 hour  Intake 819.85 ml  Output 1760 ml  Net -940.15 ml    LBM: Last BM Date:  (pta) Baseline Weight: Weight: 122.5 kg Most recent weight: Weight: 122.5 kg         Time In: 3:40 Time Out: 4:15 Time Total: 35 min Greater than 50%  of this time was spent counseling and coordinating care related to the above assessment and plan.  Signed by: Asencion Gowda, NP   Please contact Palliative Medicine Team phone at [phone] for questions and concerns.  For individual provider: See Shea Evans

## 2020-06-30 NOTE — Progress Notes (Signed)
                                                                                                                                                                                                         Daily Progress Note   Patient Name: Kristopher Blake       Date: 06/30/2020 DOB: 08/20/1945  Age: 74 y.o. MRN#: [id number] Attending Physician: Kasa, Kurian, MD Primary Care Physician: Patient, No Pcp Per Admit Date: 06/01/2020  Reason for Consultation/Follow-up: Establishing goals of care  Subjective: Patient is resting in bed on ventilator. Wife is at bedside. She states her children were happy to be able to see their father through E link. She shares their past, and that they have known each other since childhood.   We discussed his diagnoses, prognosis, GOC, EOL wishes disposition and options.  A detailed discussion was had today regarding advanced directives.  Concepts specific to code status, artifical feeding and hydration, and continued ventilator support was discussed.  The difference between an aggressive medical intervention path and a comfort care path was discussed.  Values and goals of care important to patient and family were attempted to be elicited.  Discussed limitations of medical interventions to prolong quality of life in some situations and discussed the concept of human mortality.  Discussed what QOL would be acceptable to him. Broached different scenarios. She states she wants him to be able to come home with her; she just wants him to be with her. There is not specific boundaries on QOL at home otherwise. She states he would not want to live in a facility, and he would not want to live in a vegetative state. She discusses concerns for his care in the ED.   She will consider if he would want reintubation. She states she would want CPR as he had that on Sunday, and would not be alive now without it. Will continue to follow.    Length of Stay: 3  Current  Medications: Scheduled Meds:  . vitamin C  500 mg Per Tube Daily  . azithromycin  250 mg Per Tube Daily  . chlorhexidine gluconate (MEDLINE KIT)  15 mL Mouth Rinse BID  . Chlorhexidine Gluconate Cloth  6 each Topical Daily  . docusate  100 mg Per Tube BID  . enoxaparin (LOVENOX) injection  40 mg Subcutaneous Q24H  . famotidine  20 mg Per Tube Daily  . feeding supplement (PROSource TF)  90 mL Per Tube TID  . ipratropium-albuterol  3 mL Nebulization Q4H  . mouth rinse  15   mL Mouth Rinse 10 times per day  . methylPREDNISolone (SOLU-MEDROL) injection  40 mg Intravenous Q12H  . polyethylene glycol  17 g Per Tube Daily  . zinc sulfate  220 mg Per Tube Daily    Continuous Infusions: . sodium chloride    . feeding supplement (VITAL 1.5 CAL) 1,000 mL (06/29/20 1956)  . fentaNYL infusion INTRAVENOUS 50 mcg/hr (06/30/20 1400)  . propofol (DIPRIVAN) infusion 20 mcg/kg/min (06/30/20 1138)    PRN Meds: acetaminophen, albuterol, fentaNYL, hydrALAZINE  Physical Exam Constitutional:      Comments: On ventilator.               Vital Signs: BP 126/64   Pulse 96   Temp 98.6 F (37 C)   Resp 20   Ht 5' 10" (1.778 m)   Wt 122.5 kg   SpO2 94%   BMI 38.74 kg/m  SpO2: SpO2: 94 % O2 Device: O2 Device: Ventilator O2 Flow Rate: O2 Flow Rate (L/min): 6 L/min  Intake/output summary:   Intake/Output Summary (Last 24 hours) at 06/30/2020 1553 Last data filed at 06/30/2020 1400 Gross per 24 hour  Intake 2092.59 ml  Output 1830 ml  Net 262.59 ml   LBM: Last BM Date:  (pta) Baseline Weight: Weight: 122.5 kg Most recent weight: Weight: 122.5 kg         Patient Active Problem List   Diagnosis Date Noted  . COPD exacerbation ([place]) 06/08/2020  . Acute on chronic respiratory failure with hypoxia ([place]) 06/20/2020  . COVID-19 virus infection 05/30/2020  . Hypokalemia 06/17/2020  . Acute metabolic encephalopathy 50/01/3817  . Acute respiratory failure ([place]) 06/03/2020  . Pain due to  onychomycosis of toenails of both feet 12/03/2018  . COPD (chronic obstructive pulmonary disease) ([place]) 11/28/2017  . H/O asbestos exposure 11/28/2017  . HLD (hyperlipidemia) 11/28/2017  . HTN (hypertension) 11/28/2017  . Obesity, unspecified 11/28/2017  . Former cigarette smoker 06/05/2014    Palliative Care Assessment & Plan    Recommendations/Plan:  Full code/full scope.   Will continue to follow.  Code Status:    Code Status Orders  (From admission, onward)         Start     Ordered   06/22/2020 1956  Full code  Continuous        06/16/2020 2001        Code Status History    Date Active Date Inactive Code Status Order ID Comments User Context   06/15/2020 1500 06/05/2020 2001 Full Code [id number]  Ivor Costa, MD ED   Advance Care Planning Activity       Prognosis:  Poor    Care plan was discussed with RN  Thank you for allowing the Palliative Medicine Team to assist in the care of this patient.   Total Time 35 min Prolonged Time Billed  no      Greater than 50%  of this time was spent counseling and coordinating care related to the above assessment and plan.  Asencion Gowda, NP  Please contact Palliative Medicine Team phone at [phone] for questions and concerns.

## 2020-06-30 NOTE — Progress Notes (Signed)
CRITICAL CARE NOTE  75 year old male post COVID-47 infection 06/05/2020 presenting to the ED with 2 days of cough and shortness of breath at home after discharge from the [place]. patient developed acute hypercapnic respiratory failure, becoming obtunded requiring rapid sequence intubation and mechanical ventilation admitted to ICU.  Recent COVID-19 infection on 06/05/2020 with hospitalization at the [place].  Upon discharge from the [place] patient was prescribed 3 L nasal cannula of oxygen to be worn around the clock at home. Per wife's report patient had a repeat Covid test that was -2 weeks ago.   Patient reported to the ED physician upon arrival that on the morning of 06/14/2020 he checked his SPO2 at home and it was 82% on 3 L and came to the ED to be evaluated. He denied chest pain/recent fever.  ED course: D-dimer elevated >>CTA negative for PE &troponins flat at 10 x2. Due to concern for COPD exacerbation, patient received 2 duo nebs and a dose of Solu-Medrol.  Additional work-up performed for acute encephalopathy with intermittent "jerks" causing concern for possible seizures activity including bilateral lower extremity ultrasound negative for DVT, consultation with neurology and Keppra administration. ABG revealed PCO2 at 148, PCCM stat consulted for possible need of emergent intubation and mechanical ventilation due to obtunded LOC.  Past Medical History:  COPD -3 L nasal cannula chronic O2 Hypertension Asbestos exposure Hyperlipidemia Obesity Former smoker  Significant Hospital Events:  06/16/2020-admitted by [facility], became obtunded while rooming in the ED, PCO2 148, requiring RSI and mechanical ventilation. Admitted to the ICU 1/31 severe resp failure, failed weaning trials 2/1 severe resp failure   Procedures:  06/08/2020 ETT >>  Significant Diagnostic Tests:  06/24/2020 CTa chest>>no acute findings, negative for PE, no evidence of pneumonia or pulmonary edema. Pleural plaques  bilaterally compatible with sequela of asbestos exposure. Asymmetrically prominent pleural thickening in the right upper lobe.  05/31/2020 [place] BLE>>negative for DVT 06/09/2020 CT head>>sinusitis primarily on the right maxillary antrum, no other abnormality Micro Data:  06/16/2020 COVID-19 >>NEGATIVE  Antimicrobials:  06/15/2020 azithromycin >>         CC  follow up respiratory failure  SUBJECTIVE Patient remains critically ill Prognosis is guarded   BP (!) 116/59    Pulse 71    Temp 97.7 F (36.5 C)    Resp 14    Ht '5\' 10"'  (1.778 m)    Wt 122.5 kg    SpO2 94%    BMI 38.74 kg/m    I/O last 3 completed shifts: In: 1948.7 [I.V.:1848.7; IV Piggyback:100] Out: 2200 [Urine:2200] No intake/output data recorded.  SpO2: 94 % O2 Flow Rate (L/min): 6 L/min FiO2 (%): 40 %  Estimated body mass index is 38.74 kg/m as calculated from the following:   Height as of this encounter: '5\' 10"'  (1.778 m).   Weight as of this encounter: 122.5 kg.  SIGNIFICANT EVENTS   REVIEW OF SYSTEMS  PATIENT IS UNABLE TO PROVIDE COMPLETE REVIEW OF SYSTEMS DUE TO SEVERE CRITICAL ILLNESS        PHYSICAL EXAMINATION:  GENERAL:critically ill appearing, +resp distress HEAD: Normocephalic, atraumatic.  EYES: Pupils equal, round, reactive to light.  No scleral icterus.  MOUTH: Moist mucosal membrane. NECK: Supple.  PULMONARY: +rhonchi, +wheezing CARDIOVASCULAR: S1 and S2. Regular rate and rhythm. No murmurs, rubs, or gallops.  GASTROINTESTINAL: Soft, nontender, -distended.  Positive bowel sounds.   MUSCULOSKELETAL: No swelling, clubbing, or edema.  NEUROLOGIC: obtunded, GCS<8 SKIN:intact,warm,dry  MEDICATIONS: I have reviewed all medications and confirmed regimen as documented  CULTURE RESULTS   Recent Results (from the past 240 hour(s))  SARS Coronavirus 2 by RT PCR (hospital order, performed in [facility] hospital lab) Nasopharyngeal Nasopharyngeal Swab     Status: None    Collection Time: 06/16/2020  9:36 PM   Specimen: Nasopharyngeal Swab  Result Value Ref Range Status   SARS Coronavirus 2 NEGATIVE NEGATIVE Final    Comment: (NOTE) SARS-CoV-2 target nucleic acids are NOT DETECTED.  The SARS-CoV-2 RNA is generally detectable in upper and lower respiratory specimens during the acute phase of infection. The lowest concentration of SARS-CoV-2 viral copies this assay can detect is 250 copies / mL. A negative result does not preclude SARS-CoV-2 infection and should not be used as the sole basis for treatment or other patient management decisions.  A negative result may occur with improper specimen collection / handling, submission of specimen other than nasopharyngeal swab, presence of viral mutation(s) within the areas targeted by this assay, and inadequate number of viral copies (<250 copies / mL). A negative result must be combined with clinical observations, patient history, and epidemiological information.  Fact Sheet for Patients:   StrictlyIdeas.no  Fact Sheet for Healthcare Providers: BankingDealers.co.za  This test is not yet approved or  cleared by the [place] FDA and has been authorized for detection and/or diagnosis of SARS-CoV-2 by FDA under an Emergency Use Authorization (EUA).  This EUA will remain in effect (meaning this test can be used) for the duration of the COVID-19 declaration under Section 564(b)(1) of the Act, 21 U.S.C. section 360bbb-3(b)(1), unless the authorization is terminated or revoked sooner.  Performed at [facility], [address] [place], [place] [address]   MRSA PCR Screening     Status: None   Collection Time: 06/09/2020 11:39 PM   Specimen: Nasopharyngeal  Result Value Ref Range Status   MRSA by PCR NEGATIVE NEGATIVE Final    Comment:        The GeneXpert MRSA Assay (FDA approved for NASAL specimens only), is one component of a comprehensive MRSA  colonization surveillance program. It is not intended to diagnose MRSA infection nor to guide or monitor treatment for MRSA infections. Performed at [facility], [address] [place], [place] [address]           IMAGING    EEG  Result Date: 06/29/2020 Lora Havens, MD     06/29/2020  3:35 PM Patient Name: Kristopher Blake [place] MRN: [id number] Epilepsy Attending: Lora Havens Referring Physician/Provider: Dr Ivor Costa Date: 06/29/2020 Duration: 22.48 mins Patient history: 75yo M with ams. EEG to evaluate for seizure Level of alertness:  comatose AEDs during EEG study: Propofol Technical aspects: This EEG study was done with scalp electrodes positioned according to the 10-20 International system of electrode placement. Electrical activity was acquired at a sampling rate of '500Hz'  and reviewed with a high frequency filter of '70Hz'  and a low frequency filter of '1Hz' . EEG data were recorded continuously and digitally stored. Description: EEG showed continuou generalized 3 to 6 Hz theta-delta slowing. Generalized periodic discharges with triphasic morphology at '1Hz'  were also noted. Hyperventilation and photic stimulation were not performed.   ABNORMALITY -Continuous slow, generalized -Periodic discharges with triphasic morphology, generalized IMPRESSION: This study is suggestive of severe diffuse encephalopathy, nonspecific etiology. Periodic discharges with triphasic morphology were noted which are on the ictal-interictal continuum. No seizures  were seen throughout the recording. Priyanka Barbra Sarks     Nutrition Status: Nutrition Problem: Inadequate oral intake Etiology:  inability to eat Signs/Symptoms: NPO status Interventions: Tube feeding,Prostat  CBC    Component Value Date/Time   WBC 8.8 06/29/2020 0446   RBC 3.97 (L) 06/29/2020 0446   HGB 11.5 (L) 06/29/2020 0446   HCT 37.4 (L) 06/29/2020 0446   PLT 194 06/29/2020 0446   MCV 94.2 06/29/2020 0446   MCH 29.0 06/29/2020  0446   MCHC 30.7 06/29/2020 0446   RDW 13.5 06/29/2020 0446   LYMPHSABS 1.2 06/04/2020 0658   MONOABS 0.9 05/31/2020 0658   EOSABS 3.2 (H) 06/19/2020 0658   BASOSABS 0.1 06/15/2020 0658   BMP Latest Ref Rng & Units 06/30/2020 06/29/2020 06/28/2020  Glucose 70 - 99 mg/dL 149(H) 158(H) 122(H)  BUN 8 - 23 mg/dL 65(H) 51(H) 30(H)  Creatinine 0.61 - 1.24 mg/dL 2.07(H) 2.72(H) 1.46(H)  Sodium 135 - 145 mmol/L 139 138 139  Potassium 3.5 - 5.1 mmol/L 3.8 3.7 3.6  Chloride 98 - 111 mmol/L 92(L) 88(L) 90(L)  CO2 22 - 32 mmol/L 35(H) 34(H) 36(H)  Calcium 8.9 - 10.3 mg/dL 8.2(L) 8.3(L) 9.1      Indwelling Urinary Catheter continued, requirement due to   Reason to continue Indwelling Urinary Catheter strict Intake/Output monitoring for hemodynamic instability   Central Line/ continued, requirement due to  Reason to continue [place] of central venous pressure or other hemodynamic parameters and poor IV access   Ventilator continued, requirement due to severe respiratory failure   Ventilator Sedation RASS 0 to -2      ASSESSMENT AND PLAN SYNOPSIS   Severe ACUTE Hypoxic and Hypercapnic Respiratory Failure due to severe COPD exacerbation with previous COVID 19 infection and morbid obesity  Severe ACUTE Hypoxic and Hypercapnic Respiratory Failure -continue Full MV support -continue Bronchodilator Therapy -Wean Fio2 and PEEP as tolerated -will perform SAT/SBT when respiratory parameters are met -VAP/VENT bundle implementation  ACUTE DIASTOLIC CARDIAC FAILURE-  -oxygen as needed -Lasix as tolerated  Morbid obesity, possible OSA.   Will certainly impact respiratory mechanics, ventilator weaning Suspect will need to consider additional PEEP   ACUTE KIDNEY INJURY/Renal Failure -continue Foley Catheter-assess need -Avoid nephrotoxic agents -Follow urine output, BMP -Ensure adequate renal perfusion, optimize oxygenation -Renal dose medications     NEUROLOGY Acute  toxic metabolic encephalopathy, Goal RASS -2 to -3 Wake up assessment pending Await for wife to arrive  CARDIAC ICU monitoring  ID -continue IV abx as prescibed -follow up cultures  GI GI PROPHYLAXIS as indicated  NUTRITIONAL STATUS Nutrition Status: Nutrition Problem: Inadequate oral intake Etiology: inability to eat Signs/Symptoms: NPO status Interventions: Tube feeding,Prostat   DIET-->TF's as tolerated Constipation protocol as indicated  ENDO - will use ICU hypoglycemic\Hyperglycemia protocol if indicated     ELECTROLYTES -follow labs as needed -replace as needed -pharmacy consultation and following   DVT/GI PRX ordered and assessed TRANSFUSIONS AS NEEDED MONITOR FSBS I Assessed the need for Labs I Assessed the need for Foley I Assessed the need for Central Venous Line Family Discussion when available I Assessed the need for Mobilization I made an Assessment of medications to be adjusted accordingly Safety Risk assessment completed   CASE DISCUSSED IN MULTIDISCIPLINARY ROUNDS WITH ICU TEAM  Critical Care Time devoted to patient care services described in this note is 45  minutes.   Overall, patient is critically ill, prognosis is guarded.  Patient with Multiorgan failure and at high risk for cardiac arrest and death.    Corrin Parker, M.D.  [facility]  Medical Director [facility]  [facility] Cardio-Pulmonary Department

## 2020-06-30 DEATH — deceased

## 2020-07-01 ENCOUNTER — Inpatient Hospital Stay: Payer: No Typology Code available for payment source

## 2020-07-01 DIAGNOSIS — U071 COVID-19: Secondary | ICD-10-CM | POA: Diagnosis not present

## 2020-07-01 DIAGNOSIS — J9621 Acute and chronic respiratory failure with hypoxia: Secondary | ICD-10-CM | POA: Diagnosis not present

## 2020-07-01 DIAGNOSIS — G9341 Metabolic encephalopathy: Secondary | ICD-10-CM | POA: Diagnosis not present

## 2020-07-01 DIAGNOSIS — J441 Chronic obstructive pulmonary disease with (acute) exacerbation: Secondary | ICD-10-CM | POA: Diagnosis not present

## 2020-07-01 LAB — GLUCOSE, CAPILLARY
Glucose-Capillary: 134 mg/dL — ABNORMAL HIGH (ref 70–99)
Glucose-Capillary: 163 mg/dL — ABNORMAL HIGH (ref 70–99)
Glucose-Capillary: 111 mg/dL — ABNORMAL HIGH (ref 70–99)
Glucose-Capillary: 120 mg/dL — ABNORMAL HIGH (ref 70–99)
Glucose-Capillary: 130 mg/dL — ABNORMAL HIGH (ref 70–99)
Glucose-Capillary: 108 mg/dL — ABNORMAL HIGH (ref 70–99)

## 2020-07-01 LAB — CBC WITH DIFFERENTIAL/PLATELET
Abs Immature Granulocytes: 0.05 10*3/uL (ref 0.00–0.07)
Basophils Absolute: 0 10*3/uL (ref 0.0–0.1)
Basophils Relative: 0 %
Eosinophils Absolute: 0 10*3/uL (ref 0.0–0.5)
Eosinophils Relative: 0 %
HCT: 35.9 % — ABNORMAL LOW (ref 39.0–52.0)
Hemoglobin: 11.2 g/dL — ABNORMAL LOW (ref 13.0–17.0)
Immature Granulocytes: 1 %
Lymphocytes Relative: 5 %
Lymphs Abs: 0.4 10*3/uL — ABNORMAL LOW (ref 0.7–4.0)
MCH: 29.2 pg (ref 26.0–34.0)
MCHC: 31.2 g/dL (ref 30.0–36.0)
MCV: 93.7 fL (ref 80.0–100.0)
Monocytes Absolute: 0.6 10*3/uL (ref 0.1–1.0)
Monocytes Relative: 7 %
Neutro Abs: 7.6 10*3/uL (ref 1.7–7.7)
Neutrophils Relative %: 87 %
Platelets: 183 10*3/uL (ref 150–400)
RBC: 3.83 MIL/uL — ABNORMAL LOW (ref 4.22–5.81)
RDW: 14 % (ref 11.5–15.5)
WBC: 8.6 10*3/uL (ref 4.0–10.5)
nRBC: 0 % (ref 0.0–0.2)

## 2020-07-01 LAB — BASIC METABOLIC PANEL
Anion gap: 7 (ref 5–15)
BUN: 71 mg/dL — ABNORMAL HIGH (ref 8–23)
CO2: 37 mmol/L — ABNORMAL HIGH (ref 22–32)
Calcium: 8.7 mg/dL — ABNORMAL LOW (ref 8.9–10.3)
Chloride: 98 mmol/L (ref 98–111)
Creatinine, Ser: 1.49 mg/dL — ABNORMAL HIGH (ref 0.61–1.24)
GFR, Estimated: 48 mL/min — ABNORMAL LOW (ref >60)
Glucose, Bld: 138 mg/dL — ABNORMAL HIGH (ref 70–99)
Potassium: 4.2 mmol/L (ref 3.5–5.1)
Sodium: 142 mmol/L (ref 135–145)

## 2020-07-01 LAB — PHOSPHORUS: Phosphorus: 3.4 mg/dL (ref 2.5–4.6)

## 2020-07-01 LAB — TRIGLYCERIDES: Triglycerides: 79 mg/dL (ref <150)

## 2020-07-01 LAB — MAGNESIUM: Magnesium: 2.8 mg/dL — ABNORMAL HIGH (ref 1.7–2.4)

## 2020-07-01 MED ORDER — DEXMEDETOMIDINE HCL IN NACL 400 MCG/100ML IV SOLN
0.4000 ug/kg/h | INTRAVENOUS | Status: DC
  Administered 2020-07-01: 0.4 ug/kg/h via INTRAVENOUS
  Filled 2020-07-01: qty 100

## 2020-07-01 MED ORDER — SODIUM CHLORIDE 0.9% FLUSH: 10.0000 mL | INTRAVENOUS | Status: DC | PRN

## 2020-07-01 MED ORDER — SODIUM CHLORIDE 0.9% FLUSH
10.0000 mL | Freq: Two times a day (BID) | INTRAVENOUS | Status: DC
  Administered 2020-07-01 – 2020-07-04 (×7): 10 mL
  Administered 2020-07-05: 40 mL
  Administered 2020-07-05 – 2020-07-09 (×9): 10 mL

## 2020-07-01 NOTE — Progress Notes (Signed)
CRITICAL CARE NOTE 75 year old male post COVID-64 infection 06/05/2020 presenting to the ED with 2 days of cough and shortness of breath at home after discharge from the [place]. patient developed acute hypercapnic respiratory failure, becoming obtunded requiring rapid sequence intubation and mechanical ventilation admitted to ICU.  Recent COVID-19 infection on 06/05/2020 with hospitalization at the [place].  Upon discharge from the [place] patient was prescribed 3 L nasal cannula of oxygen to be worn around the clock at home. Per wife's report patient had a repeat Covid test that was -2 weeks ago.   Patient reported to the ED physician upon arrival that on the morning of 06/07/2020 he checked his SPO2 at home and it was 82% on 3 L and came to the ED to be evaluated. He denied chest pain/recent fever.  ED course: D-dimer elevated >>CTA negative for PE &troponins flat at 10 x2. Due to concern for COPD exacerbation, patient received 2 duo nebs and a dose of Solu-Medrol.  Additional work-up performed for acute encephalopathy with intermittent "jerks" causing concern for possible seizures activity including bilateral lower extremity ultrasound negative for DVT, consultation with neurology and Keppra administration. ABG revealed PCO2 at 148, PCCM stat consulted for possible need of emergent intubation and mechanical ventilation due to obtunded LOC.  Past Medical History:  COPD -3 L nasal cannula chronic O2 Hypertension Asbestos exposure Hyperlipidemia Obesity Former smoker  Significant Hospital Events:  06/14/2020-admitted by [facility], became obtunded while rooming in the ED, PCO2 148, requiring RSI and mechanical ventilation. Admitted to the ICU 1/31 severe resp failure, failed weaning trials 2/1 severe resp failure 2/2 failed multiple weaning trials, severe resp failure   Procedures:  06/26/2020 ETT >>  Significant Diagnostic Tests:  06/18/2020 CTa chest>>no acute findings, negative for PE, no  evidence of pneumonia or pulmonary edema. Pleural plaques bilaterally compatible with sequela of asbestos exposure. Asymmetrically prominent pleural thickening in the right upper lobe.  06/14/2020 [place] BLE>>negative for DVT 06/09/2020 CT head>>sinusitis primarily on the right maxillary antrum, no other abnormality Micro Data:  06/08/2020 COVID-19 >>NEGATIVE  Antimicrobials:  06/03/2020 azithromycin >>         CC  follow up respiratory failure  SUBJECTIVE Patient remains critically ill Prognosis is guarded   BP 133/64   Pulse 95   Temp 98.78 F (37.1 C)   Resp 14   Ht '5\' 10"'  (1.778 m)   Wt 122.5 kg   SpO2 96%   BMI 38.75 kg/m    I/O last 3 completed shifts: In: 1985.2 [I.V.:1565.2; NG/GT:420] Out: 3000 [Urine:3000] No intake/output data recorded.  SpO2: 96 % O2 Flow Rate (L/min): 6 L/min FiO2 (%): 40 %  Estimated body mass index is 38.75 kg/m as calculated from the following:   Height as of this encounter: '5\' 10"'  (1.778 m).   Weight as of this encounter: 122.5 kg.  SIGNIFICANT EVENTS   REVIEW OF SYSTEMS  PATIENT IS UNABLE TO PROVIDE COMPLETE REVIEW OF SYSTEMS DUE TO SEVERE CRITICAL ILLNESS        PHYSICAL EXAMINATION:  GENERAL:critically ill appearing, +resp distress HEAD: Normocephalic, atraumatic.  EYES: Pupils equal, round, reactive to light.  No scleral icterus.  MOUTH: Moist mucosal membrane. NECK: Supple.  PULMONARY: +rhonchi, +wheezing CARDIOVASCULAR: S1 and S2. Regular rate and rhythm. No murmurs, rubs, or gallops.  GASTROINTESTINAL: Soft, nontender, -distended.  Positive bowel sounds.   MUSCULOSKELETAL: No swelling, clubbing, or edema.  NEUROLOGIC: obtunded, GCS<8 SKIN:intact,warm,dry  MEDICATIONS: I have reviewed all medications and confirmed regimen as documented  CULTURE RESULTS   Recent Results (from the past 240 hour(s))  SARS Coronavirus 2 by RT PCR (hospital order, performed in [facility] hospital lab)  Nasopharyngeal Nasopharyngeal Swab     Status: None   Collection Time: 06/19/2020  9:36 PM   Specimen: Nasopharyngeal Swab  Result Value Ref Range Status   SARS Coronavirus 2 NEGATIVE NEGATIVE Final    Comment: (NOTE) SARS-CoV-2 target nucleic acids are NOT DETECTED.  The SARS-CoV-2 RNA is generally detectable in upper and lower respiratory specimens during the acute phase of infection. The lowest concentration of SARS-CoV-2 viral copies this assay can detect is 250 copies / mL. A negative result does not preclude SARS-CoV-2 infection and should not be used as the sole basis for treatment or other patient management decisions.  A negative result may occur with improper specimen collection / handling, submission of specimen other than nasopharyngeal swab, presence of viral mutation(s) within the areas targeted by this assay, and inadequate number of viral copies (<250 copies / mL). A negative result must be combined with clinical observations, patient history, and epidemiological information.  Fact Sheet for Patients:   StrictlyIdeas.no  Fact Sheet for Healthcare Providers: BankingDealers.co.za  This test is not yet approved or  cleared by the [place] FDA and has been authorized for detection and/or diagnosis of SARS-CoV-2 by FDA under an Emergency Use Authorization (EUA).  This EUA will remain in effect (meaning this test can be used) for the duration of the COVID-19 declaration under Section 564(b)(1) of the Act, 21 U.S.C. section 360bbb-3(b)(1), unless the authorization is terminated or revoked sooner.  Performed at [facility], [address] [place], [place] [address]   MRSA PCR Screening     Status: None   Collection Time: 06/02/2020 11:39 PM   Specimen: Nasopharyngeal  Result Value Ref Range Status   MRSA by PCR NEGATIVE NEGATIVE Final    Comment:        The GeneXpert MRSA Assay (FDA approved for NASAL  specimens only), is one component of a comprehensive MRSA colonization surveillance program. It is not intended to diagnose MRSA infection nor to guide or monitor treatment for MRSA infections. Performed at [facility], [address], [place], [place] [address]           IMAGING    No results found.   Nutrition Status: Nutrition Problem: Inadequate oral intake Etiology: inability to eat Signs/Symptoms: NPO status Interventions: Tube feeding,Prostat     Indwelling Urinary Catheter continued, requirement due to   Reason to continue Indwelling Urinary Catheter strict Intake/Output monitoring for hemodynamic instability   Central Line/ continued, requirement due to  Reason to continue [place] of central venous pressure or other hemodynamic parameters and poor IV access   Ventilator continued, requirement due to severe respiratory failure   Ventilator Sedation RASS 0 to -2      ASSESSMENT AND PLAN SYNOPSIS   Severe ACUTE Hypoxic and Hypercapnic Respiratory Failuredue to severe COPD exacerbation with previous COVID 19 infection and morbid obesity  Severe ACUTE Hypoxic and Hypercapnic Respiratory Failure -continue Full MV support -continue Bronchodilator Therapy -Wean Fio2 and PEEP as tolerated -will perform SAT/SBT when respiratory parameters are met -VAP/VENT bundle implementation  ACUTE DIASTOLIC CARDIAC FAILURE- EF -oxygen as needed -Lasix as tolerated   Morbid obesity, possible OSA.   Will certainly impact respiratory mechanics, ventilator weaning Suspect will need to consider additional PEEP   ACUTE KIDNEY INJURY/Renal Failure -continue Foley Catheter-assess need -Avoid nephrotoxic agents -Follow  urine output, BMP -Ensure adequate renal perfusion, optimize oxygenation -Renal dose medications     NEUROLOGY Wake up assessment pending    CARDIAC ICU monitoring  ID -continue IV abx as prescibed -follow up  cultures  GI GI PROPHYLAXIS as indicated   DIET-->TF's as tolerated Constipation protocol as indicated  ENDO - will use ICU hypoglycemic\Hyperglycemia protocol if indicated     ELECTROLYTES -follow labs as needed -replace as needed -pharmacy consultation and following   DVT/GI PRX ordered and assessed TRANSFUSIONS AS NEEDED MONITOR FSBS I Assessed the need for Labs I Assessed the need for Foley I Assessed the need for Central Venous Line Family Discussion when available I Assessed the need for Mobilization I made an Assessment of medications to be adjusted accordingly Safety Risk assessment completed   CASE DISCUSSED IN MULTIDISCIPLINARY ROUNDS WITH ICU TEAM  Critical Care Time devoted to patient care services described in this note is 55 minutes.   Overall, patient is critically ill, prognosis is guarded.  Patient with Multiorgan failure and at high risk for cardiac arrest and death.   patient will likely need trach/peg tube  Corrin Parker, M.D.  [facility]  Medical Director [place] Director [facility] Cardio-Pulmonary Department

## 2020-07-01 NOTE — Consult Note (Signed)
PHARMACY CONSULT NOTE  Pharmacy Consult for Electrolyte Monitoring and Replacement   Recent Labs: Potassium (mmol/L)  Date Value  07/01/2020 4.2   Magnesium (mg/dL)  Date Value  40/97/3532 2.8 (H)   Calcium (mg/dL)  Date Value  99/24/2683 8.7 (L)   Albumin (g/dL)  Date Value  41/96/2229 3.5   Phosphorus (mg/dL)  Date Value  79/89/2119 3.4   Sodium (mmol/L)  Date Value  07/01/2020 142     Assessment: 75 y/o male presented on 1/29 with altered mental status and SOB requiring emergency intubation in ED. Pharmacy has been consulted for electrolyte management.  Renal function now improving.  Goal of Therapy:  Electrolytes WNL  Plan:  --No electrolyte replacement warranted today --Follow-up electrolytes with AM labs tomorrow  Pricilla Riffle, PharmD 07/01/2020 1:21 PM

## 2020-07-02 ENCOUNTER — Inpatient Hospital Stay: Payer: No Typology Code available for payment source

## 2020-07-02 DIAGNOSIS — J9601 Acute respiratory failure with hypoxia: Secondary | ICD-10-CM | POA: Diagnosis not present

## 2020-07-02 DIAGNOSIS — J441 Chronic obstructive pulmonary disease with (acute) exacerbation: Secondary | ICD-10-CM | POA: Diagnosis not present

## 2020-07-02 DIAGNOSIS — Z7189 Other specified counseling: Secondary | ICD-10-CM | POA: Diagnosis not present

## 2020-07-02 DIAGNOSIS — J9621 Acute and chronic respiratory failure with hypoxia: Secondary | ICD-10-CM | POA: Diagnosis not present

## 2020-07-02 DIAGNOSIS — Z515 Encounter for palliative care: Secondary | ICD-10-CM | POA: Diagnosis not present

## 2020-07-02 DIAGNOSIS — G9341 Metabolic encephalopathy: Secondary | ICD-10-CM | POA: Diagnosis not present

## 2020-07-02 LAB — BASIC METABOLIC PANEL
Anion gap: 11 (ref 5–15)
BUN: 65 mg/dL — ABNORMAL HIGH (ref 8–23)
CO2: 38 mmol/L — ABNORMAL HIGH (ref 22–32)
Calcium: 8.9 mg/dL (ref 8.9–10.3)
Chloride: 99 mmol/L (ref 98–111)
Creatinine, Ser: 1.41 mg/dL — ABNORMAL HIGH (ref 0.61–1.24)
GFR, Estimated: 52 mL/min — ABNORMAL LOW (ref >60)
Glucose, Bld: 141 mg/dL — ABNORMAL HIGH (ref 70–99)
Potassium: 4.6 mmol/L (ref 3.5–5.1)
Sodium: 148 mmol/L — ABNORMAL HIGH (ref 135–145)

## 2020-07-02 LAB — CBC WITH DIFFERENTIAL/PLATELET
Abs Immature Granulocytes: 0.07 10*3/uL (ref 0.00–0.07)
Basophils Absolute: 0 10*3/uL (ref 0.0–0.1)
Basophils Relative: 0 %
Eosinophils Absolute: 0 10*3/uL (ref 0.0–0.5)
Eosinophils Relative: 0 %
HCT: 39 % (ref 39.0–52.0)
Hemoglobin: 11.6 g/dL — ABNORMAL LOW (ref 13.0–17.0)
Immature Granulocytes: 1 %
Lymphocytes Relative: 6 %
Lymphs Abs: 0.5 10*3/uL — ABNORMAL LOW (ref 0.7–4.0)
MCH: 28.6 pg (ref 26.0–34.0)
MCHC: 29.7 g/dL — ABNORMAL LOW (ref 30.0–36.0)
MCV: 96.1 fL (ref 80.0–100.0)
Monocytes Absolute: 0.8 10*3/uL (ref 0.1–1.0)
Monocytes Relative: 10 %
Neutro Abs: 7 10*3/uL (ref 1.7–7.7)
Neutrophils Relative %: 83 %
Platelets: 191 10*3/uL (ref 150–400)
RBC: 4.06 MIL/uL — ABNORMAL LOW (ref 4.22–5.81)
RDW: 14.4 % (ref 11.5–15.5)
WBC: 8.3 10*3/uL (ref 4.0–10.5)
nRBC: 0 % (ref 0.0–0.2)

## 2020-07-02 LAB — GLUCOSE, CAPILLARY
Glucose-Capillary: 111 mg/dL — ABNORMAL HIGH (ref 70–99)
Glucose-Capillary: 116 mg/dL — ABNORMAL HIGH (ref 70–99)
Glucose-Capillary: 116 mg/dL — ABNORMAL HIGH (ref 70–99)
Glucose-Capillary: 120 mg/dL — ABNORMAL HIGH (ref 70–99)
Glucose-Capillary: 127 mg/dL — ABNORMAL HIGH (ref 70–99)
Glucose-Capillary: 138 mg/dL — ABNORMAL HIGH (ref 70–99)

## 2020-07-02 MED ORDER — ROCURONIUM BROMIDE 50 MG/5ML IV SOLN
INTRAVENOUS | Status: AC
  Administered 2020-07-02: 50 mg via INTRAVENOUS
  Filled 2020-07-02: qty 1

## 2020-07-02 MED ORDER — ETOMIDATE 2 MG/ML IV SOLN: 20.0000 mg | Freq: Once | INTRAVENOUS | Status: AC

## 2020-07-02 MED ORDER — LACTULOSE 10 GM/15ML PO SOLN
10.0000 g | Freq: Two times a day (BID) | ORAL | Status: DC
  Administered 2020-07-02 – 2020-07-09 (×13): 10 g
  Filled 2020-07-02 (×14): qty 30

## 2020-07-02 MED ORDER — QUETIAPINE FUMARATE 25 MG PO TABS
50.0000 mg | ORAL_TABLET | Freq: Two times a day (BID) | ORAL | Status: DC
  Administered 2020-07-02 – 2020-07-09 (×16): 50 mg
  Filled 2020-07-02 (×17): qty 2

## 2020-07-02 MED ORDER — FREE WATER
200.0000 mL | Status: DC
  Administered 2020-07-02 – 2020-07-10 (×46): 200 mL

## 2020-07-02 MED ORDER — METHYLPREDNISOLONE SODIUM SUCC 40 MG IJ SOLR
20.0000 mg | Freq: Two times a day (BID) | INTRAMUSCULAR | Status: DC
  Administered 2020-07-02 (×2): 20 mg via INTRAVENOUS
  Filled 2020-07-02 (×2): qty 1

## 2020-07-02 MED ORDER — ROCURONIUM BROMIDE 50 MG/5ML IV SOLN: 50.0000 mg | Freq: Once | INTRAVENOUS | Status: AC

## 2020-07-02 MED ORDER — FENTANYL CITRATE (PF) 100 MCG/2ML IJ SOLN
100.0000 ug | Freq: Once | INTRAMUSCULAR | Status: AC
  Administered 2020-07-02: 100 ug via INTRAVENOUS

## 2020-07-02 MED ORDER — ETOMIDATE 2 MG/ML IV SOLN
INTRAVENOUS | Status: AC
  Administered 2020-07-02: 20 mg via INTRAVENOUS
  Filled 2020-07-02: qty 10

## 2020-07-02 NOTE — Progress Notes (Signed)
CRITICAL CARE NOTE 75 year old male post COVID-69 infection 06/05/2020 presenting to the ED with 2 days of cough and shortness of breath at home after discharge from the [place]. patient developed acute hypercapnic respiratory failure, becoming obtunded requiring rapid sequence intubation and mechanical ventilation admitted to ICU.  Recent COVID-19 infection on 06/05/2020 with hospitalization at the [place].  Upon discharge from the [place] patient was prescribed 3 L nasal cannula of oxygen to be worn around the clock at home. Per wife's report patient had a repeat Covid test that was -2 weeks ago.   Patient reported to the ED physician upon arrival that on the morning of 06/26/2020 he checked his SPO2 at home and it was 82% on 3 L and came to the ED to be evaluated. He denied chest pain/recent fever.  ED course: D-dimer elevated >>CTA negative for PE &troponins flat at 10 x2. Due to concern for COPD exacerbation, patient received 2 duo nebs and a dose of Solu-Medrol.  Additional work-up performed for acute encephalopathy with intermittent "jerks" causing concern for possible seizures activity including bilateral lower extremity ultrasound negative for DVT, consultation with neurology and Keppra administration. ABG revealed PCO2 at 148, PCCM stat consulted for possible need of emergent intubation and mechanical ventilation due to obtunded LOC.  Past Medical History:  COPD -3 L nasal cannula chronic O2 Hypertension Asbestos exposure Hyperlipidemia Obesity Former smoker  Significant Hospital Events:  06/22/2020-admitted by [facility], became obtunded while rooming in the ED, PCO2 148, requiring RSI and mechanical ventilation. Admitted to the ICU 1/31 severe resp failure, failed weaning trials 2/1 severe resp failure 2/2 failed multiple weaning trials, severe resp failure   Procedures:  06/02/2020 ETT >>  Significant Diagnostic Tests:  06/24/2020 CTa chest>>no acute findings, negative for PE, no  evidence of pneumonia or pulmonary edema. Pleural plaques bilaterally compatible with sequela of asbestos exposure. Asymmetrically prominent pleural thickening in the right upper lobe.  06/09/2020 [place] BLE>>negative for DVT 06/20/2020 CT head>>sinusitis primarily on the right maxillary antrum, no other abnormality Micro Data:  06/11/2020 COVID-19 >>NEGATIVE  Antimicrobials:  06/06/2020 azithromycin >>      CC  follow up respiratory failure  SUBJECTIVE Patient remains critically ill Prognosis is guarded  Vent Mode: PRVC FiO2 (%):  [40 %-60 %] 60 % Set Rate:  [14 bmp] 14 bmp Vt Set:  [500 mL] 500 mL PEEP:  [5 cmH20] 5 cmH20 Plateau Pressure:  [15 cmH20-17 cmH20] 17 cmH20 CBC    Component Value Date/Time   WBC 8.3 07/02/2020 0600   RBC 4.06 (L) 07/02/2020 0600   HGB 11.6 (L) 07/02/2020 0600   HCT 39.0 07/02/2020 0600   PLT 191 07/02/2020 0600   MCV 96.1 07/02/2020 0600   MCH 28.6 07/02/2020 0600   MCHC 29.7 (L) 07/02/2020 0600   RDW 14.4 07/02/2020 0600   LYMPHSABS 0.5 (L) 07/02/2020 0600   MONOABS 0.8 07/02/2020 0600   EOSABS 0.0 07/02/2020 0600   BASOSABS 0.0 07/02/2020 0600   BMP Latest Ref Rng & Units 07/02/2020 07/01/2020 06/30/2020  Glucose 70 - 99 mg/dL 141(H) 138(H) 149(H)  BUN 8 - 23 mg/dL 65(H) 71(H) 65(H)  Creatinine 0.61 - 1.24 mg/dL 1.41(H) 1.49(H) 2.07(H)  Sodium 135 - 145 mmol/L 148(H) 142 139  Potassium 3.5 - 5.1 mmol/L 4.6 4.2 3.8  Chloride 98 - 111 mmol/L 99 98 92(L)  CO2 22 - 32 mmol/L 38(H) 37(H) 35(H)  Calcium 8.9 - 10.3 mg/dL 8.9 8.7(L) 8.2(L)    BP 131/65   Pulse 91  Temp 97.9 F (36.6 C) (Axillary)   Resp 16   Ht _0  (1.778 m)   Wt 117.8 kg   SpO2 96%   BMI 37.26 kg/m    I/O last 3 completed shifts: In: 2455.1 [I.V.:875.1; NG/GT:1580] Out: 2650 [Urine:2650] No intake/output data recorded.  SpO2: 96 % O2 Flow Rate (L/min): 6 L/min FiO2 (%): 60 %  Estimated body mass index is 37.26 kg/m as calculated from the  following:   Height as of this encounter: _1  (1.778 m).   Weight as of this encounter: 117.8 kg.  SIGNIFICANT EVENTS   REVIEW OF SYSTEMS  PATIENT IS UNABLE TO PROVIDE COMPLETE REVIEW OF SYSTEMS DUE TO SEVERE CRITICAL ILLNESS       PHYSICAL EXAMINATION: GENERAL:critically ill appearing, +resp distress NECK: Supple.  PULMONARY: +rhonchi, +wheezing CARDIOVASCULAR: S1 and S2.  GASTROINTESTINAL: Soft, nontender, +Positive bowel sounds.  MUSCULOSKELETAL: No swelling, clubbing, or edema.  NEUROLOGIC: obtunded, GCS<8 SKIN:intact,warm,dry     MEDICATIONS: I have reviewed all medications and confirmed regimen as documented   CULTURE RESULTS   Recent Results (from the past 240 hour(s))  SARS Coronavirus 2 by RT PCR (hospital order, performed in [facility] hospital lab) Nasopharyngeal Nasopharyngeal Swab     Status: None   Collection Time: 06/16/2020  9:36 PM   Specimen: Nasopharyngeal Swab  Result Value Ref Range Status   SARS Coronavirus 2 NEGATIVE NEGATIVE Final    Comment: (NOTE) SARS-CoV-2 target nucleic acids are NOT DETECTED.  The SARS-CoV-2 RNA is generally detectable in upper and lower respiratory specimens during the acute phase of infection. The lowest concentration of SARS-CoV-2 viral copies this assay can detect is 250 copies / mL. A negative result does not preclude SARS-CoV-2 infection and should not be used as the sole basis for treatment or other patient management decisions.  A negative result may occur with improper specimen collection / handling, submission of specimen other than nasopharyngeal swab, presence of viral mutation(s) within the areas targeted by this assay, and inadequate number of viral copies (<250 copies / mL). A negative result must be combined with clinical observations, patient history, and epidemiological information.  Fact Sheet for Patients:   StrictlyIdeas.no  Fact Sheet for Healthcare  Providers: BankingDealers.co.za  This test is not yet approved or  cleared by the [place] FDA and has been authorized for detection and/or diagnosis of SARS-CoV-2 by FDA under an Emergency Use Authorization (EUA).  This EUA will remain in effect (meaning this test can be used) for the duration of the COVID-19 declaration under Section 564(b)(1) of the Act, 21 U.S.C. section 360bbb-3(b)(1), unless the authorization is terminated or revoked sooner.  Performed at [facility], [address] [place], [place] [address]   MRSA PCR Screening     Status: None   Collection Time: 05/31/2020 11:39 PM   Specimen: Nasopharyngeal  Result Value Ref Range Status   MRSA by PCR NEGATIVE NEGATIVE Final    Comment:        The GeneXpert MRSA Assay (FDA approved for NASAL specimens only), is one component of a comprehensive MRSA colonization surveillance program. It is not intended to diagnose MRSA infection nor to guide or monitor treatment for MRSA infections. Performed at [facility], [address] [place], [place] [address]   Culture, respiratory (non-expectorated)     Status: None (Preliminary result)   Collection Time: 07/01/20 10:50 AM   Specimen: Tracheal Aspirate; Respiratory  Result Value Ref Range Status   Specimen Description  Final    TRACHEAL ASPIRATE Performed at [facility], [address] [place], [place] [address]    Special Requests   Final    NONE Performed at [facility], [place], [place] [address]    Gram Stain   Final    FEW WBC PRESENT, PREDOMINANTLY PMN MODERATE GRAM POSITIVE COCCI MODERATE GRAM POSITIVE RODS Performed at [facility], [place] [address], [place], [place] [address]    Culture PENDING  Incomplete   Report Status PENDING  Incomplete          IMAGING    MR BRAIN WO CONTRAST  Result Date: 07/01/2020 CLINICAL DATA:  Anoxic brain injury.  COVID-19.   Possible seizures. EXAM: MRI HEAD WITHOUT CONTRAST TECHNIQUE: Multiplanar, multiecho pulse sequences of the brain and surrounding structures were obtained without intravenous contrast. COMPARISON:  CT head 06/28/2020. FINDINGS: Brain: No acute infarction, hemorrhage, hydrocephalus, extra-axial collection or mass lesion. There are a few punctate T2/FLAIR hyperintensities within the white matter, likely related to mild chronic microvascular ischemic disease. Vascular: Major arterial flow voids are maintained at the skull base. Skull and upper cervical spine: Normal marrow signal. Sinuses/Orbits: Scattered mucosal thickening with air-fluid levels. Other: Small bilateral mastoid effusions.  Fluid in the pharynx. IMPRESSION: 1. No evidence of acute intracranial abnormality. 2. Paranasal sinus disease and small bilateral mastoid effusions, most likely related to the patient's intubated status. Electronically Signed   By: Margaretha Sheffield MD   On: 07/01/2020 14:58     Nutrition Status: Nutrition Problem: Inadequate oral intake Etiology: inability to eat Signs/Symptoms: NPO status Interventions: Tube feeding,Prostat     Indwelling Urinary Catheter continued, requirement due to   Reason to continue Indwelling Urinary Catheter strict Intake/Output monitoring for hemodynamic instability   Central Line/ continued, requirement due to  Reason to continue [place] of central venous pressure or other hemodynamic parameters and poor IV access   Ventilator continued, requirement due to severe respiratory failure   Ventilator Sedation RASS 0 to -2      ASSESSMENT AND PLAN SYNOPSIS   Severe ACUTE Hypoxic and Hypercapnic Respiratory Failuredue to severe COPD exacerbation with previous COVID 19 infection and morbid obesity   Severe ACUTE Hypoxic and Hypercapnic Respiratory Failure -continue Full MV support -continue Bronchodilator Therapy -Wean Fio2 and PEEP as tolerated -will perform  SAT/SBT when respiratory parameters are met -VAP/VENT bundle implementation  ACUTE DIASTOLIC CARDIAC FAILURE-  -oxygen as needed -Lasix as tolerated   Morbid obesity, possible OSA.   Will certainly impact respiratory mechanics, ventilator weaning Suspect will need to consider additional PEEP   ACUTE KIDNEY INJURY/Renal Failure -continue Foley Catheter-assess need -Avoid nephrotoxic agents -Follow urine output, BMP -Ensure adequate renal perfusion, optimize oxygenation -Renal dose medications     NEUROLOGY Wake up assessment pending   CARDIAC ICU monitoring  ID -continue IV abx as prescibed -follow up cultures  GI GI PROPHYLAXIS as indicated   DIET-->TF's as tolerated Constipation protocol as indicated  ENDO - will use ICU hypoglycemic\Hyperglycemia protocol if indicated    ELECTROLYTES -follow labs as needed -replace as needed -pharmacy consultation and following   DVT/GI PRX ordered and assessed TRANSFUSIONS AS NEEDED MONITOR FSBS I Assessed the need for Labs I Assessed the need for Foley I Assessed the need for Central Venous Line Family Discussion when available I Assessed the need for Mobilization I made an Assessment of medications to be adjusted accordingly Safety Risk assessment completed   CASE DISCUSSED IN MULTIDISCIPLINARY ROUNDS  WITH ICU TEAM  Critical Care Time devoted to patient care services described in this note is 47 minutes.   Overall, patient is critically ill, prognosis is guarded.  Patient with Multiorgan failure and at high risk for cardiac arrest and death.   Overall, patient is critically ill, prognosis is guarded.  Patient with Multiorgan failure and at high risk for cardiac arrest and death.   patient will likely need trach/peg tube   Corrin Parker, M.D.  [facility]  Medical Director [place] Director [facility] Cardio-Pulmonary Department

## 2020-07-02 NOTE — Progress Notes (Signed)
RT called to room to assess patients ET tube. Patients tube had become dislodged and needed to be replaced. Jeri Modena, NP,  was at bedside and re-intubated patient with a #8 oral ET tube with no complications. Color change noted on capnometer and tube was seen passing through the cords with the glide scope. Tube was secured with commercial tube holder at 25cm at the lip.

## 2020-07-02 NOTE — Progress Notes (Signed)
Daily Progress Note   Patient Name: Kristopher Blake       Date: 07/02/2020 DOB: January 23, 1946  Age: 75 y.o. MRN#: [id number] Attending Physician: Flora Lipps, MD Primary Care Physician: Patient, No Pcp Per Admit Date: 06/23/2020  Reason for Consultation/Follow-up: Establishing goals of care  Subjective: Patient is resting in bed on ventilator. No family at bedside. Spoke with wife; she states she is at home trying to rest. She discusses his extubation this morning and reintubation. She is aware of status and has discusse plans moving forward with CCM. I will f/u on Monday.   Length of Stay: 5  Current Medications: Scheduled Meds:  . vitamin C  500 mg Per Tube Daily  . chlorhexidine gluconate (MEDLINE KIT)  15 mL Mouth Rinse BID  . Chlorhexidine Gluconate Cloth  6 each Topical Daily  . docusate  100 mg Per Tube BID  . enoxaparin (LOVENOX) injection  40 mg Subcutaneous Q24H  . famotidine  20 mg Per Tube Daily  . feeding supplement (PROSource TF)  90 mL Per Tube TID  . free water  200 mL Per Tube Q4H  . ipratropium-albuterol  3 mL Nebulization Q4H  . lactulose  10 g Per Tube BID  . mouth rinse  15 mL Mouth Rinse 10 times per day  . methylPREDNISolone (SOLU-MEDROL) injection  20 mg Intravenous Q12H  . polyethylene glycol  17 g Per Tube Daily  . QUEtiapine  50 mg Per Tube BID  . sodium chloride flush  10-40 mL Intracatheter Q12H  . zinc sulfate  220 mg Per Tube Daily    Continuous Infusions: . sodium chloride    . dexmedetomidine (PRECEDEX) IV infusion Stopped (07/01/20 1900)  . feeding supplement (VITAL 1.5 CAL) 1,000 mL (07/02/20 0323)  . fentaNYL infusion INTRAVENOUS 50 mcg/hr (07/02/20 1300)  . propofol (DIPRIVAN) infusion 25 mcg/kg/min (07/02/20 1300)    PRN  Meds: acetaminophen, albuterol, fentaNYL, hydrALAZINE, sodium chloride flush  Physical Exam Constitutional:      Comments: On ventilator.              Vital Signs: BP 129/67   Pulse 80   Temp 98 F (36.7 C) (Axillary)   Resp 14   Ht '5\' 10"'  (1.778 m)   Wt 117.8 kg   SpO2 96%   BMI 37.26 kg/m  SpO2: SpO2: 96 % O2 Device: O2 Device: Ventilator O2 Flow Rate: O2 Flow Rate (L/min): 6 L/min  Intake/output summary:   Intake/Output Summary (Last 24 hours) at 07/02/2020 1501 Last data filed at 07/02/2020 1300 Gross per 24 hour  Intake 2615.9 ml  Output 3050 ml  Net -434.1 ml   LBM: Last BM Date:  (PTA) Baseline Weight: Weight: 122.5 kg Most recent weight: Weight: 117.8 kg   Patient Active Problem List   Diagnosis Date Noted  . COPD exacerbation ([place]) 05/30/2020  . Acute on chronic respiratory failure with hypoxia ([place]) 06/04/2020  . COVID-19 virus infection 06/22/2020  . Hypokalemia 06/20/2020  . Acute metabolic encephalopathy 72/01/1979  . Acute respiratory failure ([place]) 06/05/2020  . Pain due to onychomycosis of toenails of both feet 12/03/2018  . COPD (chronic obstructive pulmonary disease) ([place]) 11/28/2017  . H/O asbestos exposure 11/28/2017  . HLD (hyperlipidemia) 11/28/2017  . HTN (hypertension) 11/28/2017  . Obesity, unspecified 11/28/2017  . Former cigarette smoker 06/05/2014    Palliative Care Assessment & Plan    Recommendations/Plan:  Continue current care.   Code Status:    Code Status Orders  (From admission, onward)         Start     Ordered   06/26/2020 1956  Full code  Continuous        06/19/2020 2001        Code Status History    Date Active Date Inactive Code Status Order ID Comments User Context   06/23/2020 1500 06/13/2020 2001 Full Code [id number]  Ivor Costa, MD ED   Advance Care Planning Activity       Prognosis:  Poor    Care plan was discussed with RN  Thank you for allowing the Palliative Medicine Team to assist in the  care of this patient.   Total Time 15 min Prolonged Time Billed  no      Greater than 50%  of this time was spent counseling and coordinating care related to the above assessment and plan.  Asencion Gowda, NP  Please contact Palliative Medicine Team phone at [phone] for questions and concerns.

## 2020-07-02 NOTE — Progress Notes (Signed)
GOALS OF CARE DISCUSSION  The Clinical status was relayed to family in detail. Wife at bedside Updated and notified of patients medical condition.  Patient remains unresponsive and will not open eyes to command.    Patient is having a weak cough and struggling to remove secretions.   Patient with increased WOB and using accessory muscles to breathe Explained to family course of therapy and the modalities     Patient with Progressive multiorgan failure with a very high probablity of a very minimal chance of meaningful recovery despite all aggressive and optimal medical therapy. Process associated with Suffering.  Family understands the situation.  Patient Remains Full CODE  Family are satisfied with Plan of action and management. All questions answered  Additional CC time 31 mins   Kristopher Blake Kristopher Blake, M.D.  [facility]  Medical Director [facility] Medical Director [facility] Cardio-Pulmonary Department

## 2020-07-02 NOTE — Progress Notes (Addendum)
GOALS OF CARE DISCUSSION-PALLIATIVE CARE  The Clinical status was relayed to family in detail Wife Hilda Lias over the phone  Updated and notified of patients medical condition.  Patient remains unresponsive and will not open eyes to command.    Patient is having a weak cough and struggling to remove secretions.   Patient with increased WOB and using accessory muscles to breathe Explained to family course of therapy and the modalities     Patient with Progressive multiorgan failure with a very high probablity of a very minimal chance of meaningful recovery despite all aggressive and optimal medical therapy. Patient is in the Dying  Process associated with Suffering. Patient had acute cardiac arrest several days ago with rib fractures-the wife has stated that she does NOT want [place] to break his ribs again.   Wife now understands the situation.  The Wife has consented and agreed to DNR status.   Family are satisfied with Plan of action and management. All questions answered  Additional CC time 35 mins   Simran Mannis Santiago Glad, M.D.  [facility]  Medical Director [facility] Medical Director [facility] Cardio-Pulmonary Department

## 2020-07-02 NOTE — Consult Note (Signed)
PHARMACY CONSULT NOTE  Pharmacy Consult for Electrolyte Monitoring and Replacement   Recent Labs: Potassium (mmol/L)  Date Value  07/02/2020 4.6   Magnesium (mg/dL)  Date Value  82/57/4935 2.8 (H)   Calcium (mg/dL)  Date Value  52/17/4715 8.9   Albumin (g/dL)  Date Value  95/39/6728 3.5   Phosphorus (mg/dL)  Date Value  97/91/5041 3.4   Sodium (mmol/L)  Date Value  07/02/2020 148 (H)     Assessment: 75 y/o male presented on 1/29 with altered mental status and SOB requiring emergency intubation in ED. Pharmacy has been consulted for electrolyte management.  Renal function now improving.  Goal of Therapy:  Electrolytes WNL  Plan:  --No electrolyte replacement warranted today --Continue to follow along  Pricilla Riffle, PharmD 07/02/2020 12:15 PM

## 2020-07-02 NOTE — Procedures (Signed)
Endotracheal Intubation: Patient required placement of an artificial airway secondary to Respiratory Failure  Consent: Emergent  ETT was dislodged  Hand washing performed prior to starting the procedure.   Medications administered for sedation prior to procedure:  Etomidate 20 mg IV,  Rocuronium 10 mg IV, Fentanyl 100 mcg IV.    A time out procedure was called and correct patient, name, & ID confirmed. Needed supplies and equipment were assembled and checked to include ETT, 10 ml syringe, Glidescope, Mac and Miller blades, suction, oxygen and bag mask valve, end tidal CO2 monitor.   Patient was positioned to align the mouth and pharynx to facilitate visualization of the glottis.   Heart rate, SpO2 and blood pressure was continuously monitored during the procedure. Pre-oxygenation was conducted prior to intubation and endotracheal tube was placed through the vocal cords into the trachea.     The artificial airway was placed under direct visualization via glidescope route using a 8.0 ETT on the first attempt.  ETT was secured at 25 cm mark.  Placement was confirmed by auscuitation of lungs with good breath sounds bilaterally and no stomach sounds.  Condensation was noted on endotracheal tube.   Pulse ox 98%.  CO2 detector in place with appropriate color change.   Complications: None .   Operator: [place] Mallozzi.   Chest radiograph ordered and pending.    Corrin Parker, M.D.  [facility]  Medical Director [place] Director [facility] Cardio-Pulmonary Department

## 2020-07-03 LAB — CBC WITH DIFFERENTIAL/PLATELET
Abs Immature Granulocytes: 0.05 10*3/uL (ref 0.00–0.07)
Basophils Absolute: 0 10*3/uL (ref 0.0–0.1)
Basophils Relative: 0 %
Eosinophils Absolute: 0.1 10*3/uL (ref 0.0–0.5)
Eosinophils Relative: 1 %
HCT: 38.3 % — ABNORMAL LOW (ref 39.0–52.0)
Hemoglobin: 11.7 g/dL — ABNORMAL LOW (ref 13.0–17.0)
Immature Granulocytes: 1 %
Lymphocytes Relative: 8 %
Lymphs Abs: 0.6 10*3/uL — ABNORMAL LOW (ref 0.7–4.0)
MCH: 29.2 pg (ref 26.0–34.0)
MCHC: 30.5 g/dL (ref 30.0–36.0)
MCV: 95.5 fL (ref 80.0–100.0)
Monocytes Absolute: 0.7 10*3/uL (ref 0.1–1.0)
Monocytes Relative: 8 %
Neutro Abs: 6.8 10*3/uL (ref 1.7–7.7)
Neutrophils Relative %: 82 %
Platelets: 189 10*3/uL (ref 150–400)
RBC: 4.01 MIL/uL — ABNORMAL LOW (ref 4.22–5.81)
RDW: 14.6 % (ref 11.5–15.5)
WBC: 8.1 10*3/uL (ref 4.0–10.5)
nRBC: 0 % (ref 0.0–0.2)

## 2020-07-03 LAB — GLUCOSE, CAPILLARY
Glucose-Capillary: 103 mg/dL — ABNORMAL HIGH (ref 70–99)
Glucose-Capillary: 109 mg/dL — ABNORMAL HIGH (ref 70–99)
Glucose-Capillary: 125 mg/dL — ABNORMAL HIGH (ref 70–99)
Glucose-Capillary: 142 mg/dL — ABNORMAL HIGH (ref 70–99)
Glucose-Capillary: 144 mg/dL — ABNORMAL HIGH (ref 70–99)
Glucose-Capillary: 160 mg/dL — ABNORMAL HIGH (ref 70–99)

## 2020-07-03 LAB — BASIC METABOLIC PANEL
Anion gap: 12 (ref 5–15)
BUN: 62 mg/dL — ABNORMAL HIGH (ref 8–23)
CO2: 39 mmol/L — ABNORMAL HIGH (ref 22–32)
Calcium: 9 mg/dL (ref 8.9–10.3)
Chloride: 97 mmol/L — ABNORMAL LOW (ref 98–111)
Creatinine, Ser: 1.3 mg/dL — ABNORMAL HIGH (ref 0.61–1.24)
GFR, Estimated: 57 mL/min — ABNORMAL LOW (ref >60)
Glucose, Bld: 148 mg/dL — ABNORMAL HIGH (ref 70–99)
Potassium: 4.7 mmol/L (ref 3.5–5.1)
Sodium: 148 mmol/L — ABNORMAL HIGH (ref 135–145)

## 2020-07-03 MED ORDER — FUROSEMIDE 10 MG/ML IJ SOLN
40.0000 mg | Freq: Every day | INTRAMUSCULAR | Status: DC
  Administered 2020-07-03 – 2020-07-09 (×7): 40 mg via INTRAVENOUS
  Filled 2020-07-03 (×7): qty 4

## 2020-07-03 MED ORDER — IPRATROPIUM-ALBUTEROL 0.5-2.5 (3) MG/3ML IN SOLN: 3.0000 mL | Freq: Three times a day (TID) | RESPIRATORY_TRACT | Status: DC

## 2020-07-03 MED ORDER — DEXAMETHASONE SODIUM PHOSPHATE 10 MG/ML IJ SOLN
6.0000 mg | INTRAMUSCULAR | Status: DC
  Administered 2020-07-03: 6 mg via INTRAVENOUS
  Filled 2020-07-03: qty 0.6

## 2020-07-03 MED ORDER — IPRATROPIUM-ALBUTEROL 0.5-2.5 (3) MG/3ML IN SOLN
3.0000 mL | Freq: Four times a day (QID) | RESPIRATORY_TRACT | Status: DC
  Administered 2020-07-03 – 2020-07-06 (×14): 3 mL via RESPIRATORY_TRACT
  Filled 2020-07-03 (×14): qty 3

## 2020-07-03 MED ORDER — BUDESONIDE 0.5 MG/2ML IN SUSP
0.5000 mg | Freq: Two times a day (BID) | RESPIRATORY_TRACT | Status: DC
  Administered 2020-07-03 – 2020-07-10 (×14): 0.5 mg via RESPIRATORY_TRACT
  Filled 2020-07-03 (×13): qty 2

## 2020-07-03 MED ORDER — METHYLPREDNISOLONE SODIUM SUCC 40 MG IJ SOLR
40.0000 mg | Freq: Two times a day (BID) | INTRAMUSCULAR | Status: DC
  Administered 2020-07-03 – 2020-07-09 (×13): 40 mg via INTRAVENOUS
  Filled 2020-07-03 (×13): qty 1

## 2020-07-03 NOTE — Progress Notes (Signed)
CRITICAL CARE NOTE 75 year old male post COVID-63 infection 06/05/2020 presenting to the ED with 2 days of cough and shortness of breath at home after discharge from the [place]. patient developed acute hypercapnic respiratory failure, becoming obtunded requiring rapid sequence intubation and mechanical ventilation admitted to ICU.  Recent COVID-19 infection on 06/05/2020 with hospitalization at the [place].  Upon discharge from the [place] patient was prescribed 3 L nasal cannula of oxygen to be worn around the clock at home. Per wife's report patient had a repeat Covid test that was -2 weeks ago.   Patient reported to the ED physician upon arrival that on the morning of 06/05/2020 he checked his SPO2 at home and it was 82% on 3 L and came to the ED to be evaluated. He denied chest pain/recent fever.  ED course: D-dimer elevated >>CTA negative for PE &troponins flat at 10 x2. Due to concern for COPD exacerbation, patient received 2 duo nebs and a dose of Solu-Medrol.  Additional work-up performed for acute encephalopathy with intermittent "jerks" causing concern for possible seizures activity including bilateral lower extremity ultrasound negative for DVT, consultation with neurology and Keppra administration. ABG revealed PCO2 at 148, PCCM stat consulted for possible need of emergent intubation and mechanical ventilation due to obtunded LOC.  Past Medical History:  COPD -3 L nasal cannula chronic O2 Hypertension Asbestos exposure Hyperlipidemia Obesity Former smoker  Significant Hospital Events:  06/26/2020-admitted by [facility], became obtunded while rooming in the ED, PCO2 148, requiring RSI and mechanical ventilation. Admitted to the ICU 1/31 severe resp failure, failed weaning trials 2/1 severe resp failure 2/2 failed multiple weaning trials, severe resp failure 2/04- patient is critically ill, I met with family today and they had asked about prognosis which I explained is poor. I have allowed 1  person at a time to come in.  Family is considering comfort care vs possibly tracheostomy.    Procedures:  06/07/2020 ETT >>  Significant Diagnostic Tests:  06/24/2020 CTa chest>>no acute findings, negative for PE, no evidence of pneumonia or pulmonary edema. Pleural plaques bilaterally compatible with sequela of asbestos exposure. Asymmetrically prominent pleural thickening in the right upper lobe.  06/12/2020 [place] BLE>>negative for DVT 06/22/2020 CT head>>sinusitis primarily on the right maxillary antrum, no other abnormality Micro Data:  06/11/2020 COVID-19 >>NEGATIVE  Antimicrobials:  06/17/2020 azithromycin >>    CC  follow up respiratory failure  SUBJECTIVE Patient remains critically ill Prognosis is guarded  Vent Mode: PSV FiO2 (%):  [40 %-60 %] 40 % Set Rate:  [14 bmp] 14 bmp Vt Set:  [500 mL] 500 mL PEEP:  [5 cmH20] 5 cmH20 Pressure Support:  [5 cmH20] 5 cmH20 Plateau Pressure:  [15 cmH20-17 cmH20] 15 cmH20 CBC    Component Value Date/Time   WBC 8.1 07/03/2020 0445   RBC 4.01 (L) 07/03/2020 0445   HGB 11.7 (L) 07/03/2020 0445   HCT 38.3 (L) 07/03/2020 0445   PLT 189 07/03/2020 0445   MCV 95.5 07/03/2020 0445   MCH 29.2 07/03/2020 0445   MCHC 30.5 07/03/2020 0445   RDW 14.6 07/03/2020 0445   LYMPHSABS 0.6 (L) 07/03/2020 0445   MONOABS 0.7 07/03/2020 0445   EOSABS 0.1 07/03/2020 0445   BASOSABS 0.0 07/03/2020 0445   BMP Latest Ref Rng & Units 07/03/2020 07/02/2020 07/01/2020  Glucose 70 - 99 mg/dL 148(H) 141(H) 138(H)  BUN 8 - 23 mg/dL 62(H) 65(H) 71(H)  Creatinine 0.61 - 1.24 mg/dL 1.30(H) 1.41(H) 1.49(H)  Sodium 135 - 145 mmol/L 148(H) 148(H)  142  Potassium 3.5 - 5.1 mmol/L 4.7 4.6 4.2  Chloride 98 - 111 mmol/L 97(L) 99 98  CO2 22 - 32 mmol/L 39(H) 38(H) 37(H)  Calcium 8.9 - 10.3 mg/dL 9.0 8.9 8.7(L)    BP (!) 143/74   Pulse (!) 102   Temp 98.6 F (37 C)   Resp (!) 23   Ht '5\' 10"'  (1.778 m)   Wt 117.8 kg   SpO2 98%   BMI 37.26 kg/m     I/O last 3 completed shifts: In: 2709.4 [I.V.:779.4; NG/GT:1930] Out: 5638 [Urine:4350] Total I/O In: 2353 [I.V.:426.8; NG/GT:1926.2] Out: -   SpO2: 98 % O2 Flow Rate (L/min): 6 L/min FiO2 (%): 40 %  Estimated body mass index is 37.26 kg/m as calculated from the following:   Height as of this encounter: '5\' 10"'  (1.778 m).   Weight as of this encounter: 117.8 kg.  SIGNIFICANT EVENTS   REVIEW OF SYSTEMS  PATIENT IS UNABLE TO PROVIDE COMPLETE REVIEW OF SYSTEMS DUE TO SEVERE CRITICAL ILLNESS       PHYSICAL EXAMINATION: GENERAL:critically ill appearing, +resp distress NECK: Supple.  PULMONARY: +rhonchi, +wheezing CARDIOVASCULAR: S1 and S2.  GASTROINTESTINAL: Soft, nontender, +Positive bowel sounds.  MUSCULOSKELETAL: No swelling, clubbing, or edema.  NEUROLOGIC: obtunded, GCS<8 SKIN:intact,warm,dry     MEDICATIONS: I have reviewed all medications and confirmed regimen as documented   CULTURE RESULTS   Recent Results (from the past 240 hour(s))  SARS Coronavirus 2 by RT PCR (hospital order, performed in [facility] hospital lab) Nasopharyngeal Nasopharyngeal Swab     Status: None   Collection Time: 06/29/2020  9:36 PM   Specimen: Nasopharyngeal Swab  Result Value Ref Range Status   SARS Coronavirus 2 NEGATIVE NEGATIVE Final    Comment: (NOTE) SARS-CoV-2 target nucleic acids are NOT DETECTED.  The SARS-CoV-2 RNA is generally detectable in upper and lower respiratory specimens during the acute phase of infection. The lowest concentration of SARS-CoV-2 viral copies this assay can detect is 250 copies / mL. A negative result does not preclude SARS-CoV-2 infection and should not be used as the sole basis for treatment or other patient management decisions.  A negative result may occur with improper specimen collection / handling, submission of specimen other than nasopharyngeal swab, presence of viral mutation(s) within the areas targeted by this assay, and  inadequate number of viral copies (<250 copies / mL). A negative result must be combined with clinical observations, patient history, and epidemiological information.  Fact Sheet for Patients:   StrictlyIdeas.no  Fact Sheet for Healthcare Providers: BankingDealers.co.za  This test is not yet approved or  cleared by the [place] FDA and has been authorized for detection and/or diagnosis of SARS-CoV-2 by FDA under an Emergency Use Authorization (EUA).  This EUA will remain in effect (meaning this test can be used) for the duration of the COVID-19 declaration under Section 564(b)(1) of the Act, 21 U.S.C. section 360bbb-3(b)(1), unless the authorization is terminated or revoked sooner.  Performed at [facility], [address] [place], [place] [address]   MRSA PCR Screening     Status: None   Collection Time: 06/03/2020 11:39 PM   Specimen: Nasopharyngeal  Result Value Ref Range Status   MRSA by PCR NEGATIVE NEGATIVE Final    Comment:        The GeneXpert MRSA Assay (FDA approved for NASAL specimens only), is one component of a comprehensive MRSA colonization surveillance program. It is not intended to diagnose MRSA infection nor to guide  or monitor treatment for MRSA infections. Performed at [facility], [address] [place], [place] [address]   Culture, respiratory (non-expectorated)     Status: None (Preliminary result)   Collection Time: 07/01/20 10:50 AM   Specimen: Tracheal Aspirate; Respiratory  Result Value Ref Range Status   Specimen Description   Final    TRACHEAL ASPIRATE Performed at [facility], [address] [place], [place] [address]    Special Requests   Final    NONE Performed at [facility], [address] [place], [place] [address]    Gram Stain   Final    FEW WBC PRESENT, PREDOMINANTLY PMN MODERATE GRAM POSITIVE COCCI MODERATE GRAM POSITIVE RODS     Culture   Final    FEW Normal respiratory flora-no Staph aureus or Pseudomonas seen Performed at [facility] [address], [place], [place] [address]    Report Status PENDING  Incomplete          IMAGING    No results found.   Nutrition Status: Nutrition Problem: Inadequate oral intake Etiology: inability to eat Signs/Symptoms: NPO status Interventions: Tube feeding,Prostat     Indwelling Urinary Catheter continued, requirement due to   Reason to continue Indwelling Urinary Catheter strict Intake/Output monitoring for hemodynamic instability   Central Line/ continued, requirement due to  Reason to continue [place] of central venous pressure or other hemodynamic parameters and poor IV access   Ventilator continued, requirement due to severe respiratory failure   Ventilator Sedation RASS 0 to -2      ASSESSMENT AND PLAN SYNOPSIS   Severe ACUTE Hypoxic and Hypercapnic Respiratory Failuredue to severe COPD exacerbation with previous COVID 19 infection and morbid obesity   Severe ACUTE Hypoxic and Hypercapnic Respiratory Failure -continue Full MV support -continue Bronchodilator Therapy -Wean Fio2 and PEEP as tolerated -will perform SAT/SBT when respiratory parameters are met -VAP/VENT bundle implementation  ACUTE DIASTOLIC CARDIAC FAILURE-  -oxygen as needed -Lasix as tolerated   Morbid obesity, possible OSA.   Will certainly impact respiratory mechanics, ventilator weaning Suspect will need to consider additional PEEP   ACUTE KIDNEY INJURY/Renal Failure -continue Foley Catheter-assess need -Avoid nephrotoxic agents -Follow urine output, BMP -Ensure adequate renal perfusion, optimize oxygenation -Renal dose medications     NEUROLOGY Wake up assessment pending   CARDIAC ICU monitoring  ID -continue IV abx as prescibed -follow up cultures  GI GI PROPHYLAXIS as indicated   DIET-->TF's as tolerated Constipation  protocol as indicated  ENDO - will use ICU hypoglycemic\Hyperglycemia protocol if indicated    ELECTROLYTES -follow labs as needed -replace as needed -pharmacy consultation and following   DVT/GI PRX ordered and assessed TRANSFUSIONS AS NEEDED MONITOR FSBS I Assessed the need for Labs I Assessed the need for Foley I Assessed the need for Central Venous Line Family Discussion when available I Assessed the need for Mobilization I made an Assessment of medications to be adjusted accordingly Safety Risk assessment completed   CASE DISCUSSED IN MULTIDISCIPLINARY ROUNDS WITH ICU TEAM  Critical Care Time devoted to patient care services described in this note is 33 minutes.   Overall, patient is critically ill, prognosis is guarded.  Patient with Multiorgan failure and at high risk for cardiac arrest and death.   Overall, patient is critically ill, prognosis is guarded.  Patient with Multiorgan failure and at high risk for cardiac arrest and death.   patient will likely need trach/peg tube    Ottie Glazier, M.D.  Pulmonary & [place]

## 2020-07-03 NOTE — Consult Note (Signed)
PHARMACY CONSULT NOTE  Pharmacy Consult for Electrolyte Monitoring and Replacement   Recent Labs: Potassium (mmol/L)  Date Value  07/03/2020 4.7   Magnesium (mg/dL)  Date Value  00/86/7619 2.8 (H)   Calcium (mg/dL)  Date Value  50/93/2671 9.0   Albumin (g/dL)  Date Value  24/58/0998 3.5   Phosphorus (mg/dL)  Date Value  33/82/5053 3.4   Sodium (mmol/L)  Date Value  07/03/2020 148 (H)     Assessment: 75 y/o male presented on 1/29 with altered mental status and SOB requiring emergency intubation in ED. Pharmacy has been consulted for electrolyte management.  Renal function now improving.  Goal of Therapy:  Electrolytes WNL  Plan:  --No electrolyte replacement warranted today --Continue to follow along  Pricilla Riffle, PharmD 07/03/2020 12:37 PM

## 2020-07-04 ENCOUNTER — Inpatient Hospital Stay: Payer: No Typology Code available for payment source

## 2020-07-04 LAB — CBC WITH DIFFERENTIAL/PLATELET
Abs Immature Granulocytes: 0.06 10*3/uL (ref 0.00–0.07)
Basophils Absolute: 0 10*3/uL (ref 0.0–0.1)
Basophils Relative: 0 %
Eosinophils Absolute: 0 10*3/uL (ref 0.0–0.5)
Eosinophils Relative: 0 %
HCT: 42.6 % (ref 39.0–52.0)
Hemoglobin: 12.7 g/dL — ABNORMAL LOW (ref 13.0–17.0)
Immature Granulocytes: 1 %
Lymphocytes Relative: 5 %
Lymphs Abs: 0.6 10*3/uL — ABNORMAL LOW (ref 0.7–4.0)
MCH: 28.5 pg (ref 26.0–34.0)
MCHC: 29.8 g/dL — ABNORMAL LOW (ref 30.0–36.0)
MCV: 95.5 fL (ref 80.0–100.0)
Monocytes Absolute: 0.6 10*3/uL (ref 0.1–1.0)
Monocytes Relative: 5 %
Neutro Abs: 10.5 10*3/uL — ABNORMAL HIGH (ref 1.7–7.7)
Neutrophils Relative %: 89 %
Platelets: 219 10*3/uL (ref 150–400)
RBC: 4.46 MIL/uL (ref 4.22–5.81)
RDW: 14.3 % (ref 11.5–15.5)
WBC: 11.8 10*3/uL — ABNORMAL HIGH (ref 4.0–10.5)
nRBC: 0 % (ref 0.0–0.2)

## 2020-07-04 LAB — BASIC METABOLIC PANEL
Anion gap: 14 (ref 5–15)
BUN: 68 mg/dL — ABNORMAL HIGH (ref 8–23)
CO2: 39 mmol/L — ABNORMAL HIGH (ref 22–32)
Calcium: 9.1 mg/dL (ref 8.9–10.3)
Chloride: 94 mmol/L — ABNORMAL LOW (ref 98–111)
Creatinine, Ser: 1.25 mg/dL — ABNORMAL HIGH (ref 0.61–1.24)
GFR, Estimated: 60 mL/min — ABNORMAL LOW (ref >60)
Glucose, Bld: 143 mg/dL — ABNORMAL HIGH (ref 70–99)
Potassium: 5.1 mmol/L (ref 3.5–5.1)
Sodium: 147 mmol/L — ABNORMAL HIGH (ref 135–145)

## 2020-07-04 LAB — GLUCOSE, CAPILLARY
Glucose-Capillary: 124 mg/dL — ABNORMAL HIGH (ref 70–99)
Glucose-Capillary: 126 mg/dL — ABNORMAL HIGH (ref 70–99)
Glucose-Capillary: 130 mg/dL — ABNORMAL HIGH (ref 70–99)
Glucose-Capillary: 152 mg/dL — ABNORMAL HIGH (ref 70–99)
Glucose-Capillary: 134 mg/dL — ABNORMAL HIGH (ref 70–99)
Glucose-Capillary: 152 mg/dL — ABNORMAL HIGH (ref 70–99)

## 2020-07-04 LAB — CULTURE, RESPIRATORY W GRAM STAIN: Culture: NORMAL

## 2020-07-04 LAB — TRIGLYCERIDES: Triglycerides: 92 mg/dL (ref <150)

## 2020-07-04 MED ORDER — ENOXAPARIN SODIUM 60 MG/0.6ML ~~LOC~~ SOLN
0.5000 mg/kg | SUBCUTANEOUS | Status: DC
  Administered 2020-07-04 – 2020-07-08 (×5): 60 mg via SUBCUTANEOUS
  Filled 2020-07-04 (×6): qty 0.6

## 2020-07-04 NOTE — Progress Notes (Signed)
PHARMACIST - PHYSICIAN COMMUNICATION  CONCERNING:  Enoxaparin (Lovenox) for DVT Prophylaxis    RECOMMENDATION: Patient was prescribed enoxaparin 40mg  q24 hours for VTE prophylaxis.   Filed Weights   06-28-2020 0651 07/01/20 0500 07/02/20 0500  Weight: 122.5 kg (270 lb) 122.5 kg (270 lb 1 oz) 117.8 kg (259 lb 11.2 oz)    Body mass index is 37.26 kg/m.  Estimated Creatinine Clearance: 64.6 mL/min (A) (by C-G formula based on SCr of 1.25 mg/dL (H)).   Based on [facility] policy patient is candidate for enoxaparin 0.5mg /kg TBW SQ every 24 hours based on BMI being >30.  DESCRIPTION: Pharmacy has adjusted enoxaparin dose per [facility] policy.  Patient is now receiving enoxaparin 60 mg every 24 hours    [facility] 07/04/2020 2:57 PM

## 2020-07-04 NOTE — Progress Notes (Signed)
CRITICAL CARE NOTE 75 year old male post COVID-107 infection 06/05/2020 presenting to the ED with 2 days of cough and shortness of breath at home after discharge from the [place]. patient developed acute hypercapnic respiratory failure, becoming obtunded requiring rapid sequence intubation and mechanical ventilation admitted to ICU.  Recent COVID-19 infection on 06/05/2020 with hospitalization at the [place].  Upon discharge from the [place] patient was prescribed 3 L nasal cannula of oxygen to be worn around the clock at home. Per wife's report patient had a repeat Covid test that was -2 weeks ago.   Patient reported to the ED physician upon arrival that on the morning of 06/05/2020 he checked his SPO2 at home and it was 82% on 3 L and came to the ED to be evaluated. He denied chest pain/recent fever.  ED course: D-dimer elevated >>CTA negative for PE &troponins flat at 10 x2. Due to concern for COPD exacerbation, patient received 2 duo nebs and a dose of Solu-Medrol.  Additional work-up performed for acute encephalopathy with intermittent "jerks" causing concern for possible seizures activity including bilateral lower extremity ultrasound negative for DVT, consultation with neurology and Keppra administration. ABG revealed PCO2 at 148, PCCM stat consulted for possible need of emergent intubation and mechanical ventilation due to obtunded LOC.    SUBJECTIVE    06/17/2020-admitted by [facility], became obtunded while rooming in the ED, PCO2 148, requiring RSI and mechanical ventilation. Admitted to the ICU 1/31 severe resp failure, failed weaning trials 2/1 severe resp failure 2/2 failed multiple weaning trials, severe resp failure 2/04- patient is critically ill, I met with family today and they had asked about prognosis which I explained is poor. I have allowed 1 person at a time to come in.  Family is considering comfort care vs possibly tracheostomy.  07/04/2020- Patient on 40% FiO2, spoke with wife, shes is  discussing goals of care with children and will speak with Palliative and PCCM team after.      Past Medical History:  COPD -3 L nasal cannula chronic O2 Hypertension Asbestos exposure Hyperlipidemia Obesity Former smoker    Procedures:  06/20/2020 ETT >>  Significant Diagnostic Tests:  06/29/2020 CTa chest>>no acute findings, negative for PE, no evidence of pneumonia or pulmonary edema. Pleural plaques bilaterally compatible with sequela of asbestos exposure. Asymmetrically prominent pleural thickening in the right upper lobe.  06/16/2020 [place] BLE>>negative for DVT 06/16/2020 CT head>>sinusitis primarily on the right maxillary antrum, no other abnormality Micro Data:  06/11/2020 COVID-19 >>NEGATIVE  Antimicrobials:  06/17/2020 azithromycin >>    CC  follow up respiratory failure  SUBJECTIVE Patient remains critically ill Prognosis is guarded  Vent Mode: PRVC FiO2 (%):  [40 %] 40 % Set Rate:  [14 bmp] 14 bmp Vt Set:  [500 mL] 500 mL PEEP:  [5 cmH20] 5 cmH20 Pressure Support:  [5 cmH20] 5 cmH20 CBC    Component Value Date/Time   WBC 11.8 (H) 07/04/2020 0234   RBC 4.46 07/04/2020 0234   HGB 12.7 (L) 07/04/2020 0234   HCT 42.6 07/04/2020 0234   PLT 219 07/04/2020 0234   MCV 95.5 07/04/2020 0234   MCH 28.5 07/04/2020 0234   MCHC 29.8 (L) 07/04/2020 0234   RDW 14.3 07/04/2020 0234   LYMPHSABS 0.6 (L) 07/04/2020 0234   MONOABS 0.6 07/04/2020 0234   EOSABS 0.0 07/04/2020 0234   BASOSABS 0.0 07/04/2020 0234   BMP Latest Ref Rng & Units 07/04/2020 07/03/2020 07/02/2020  Glucose 70 - 99 mg/dL 143(H) 148(H) 141(H)  BUN 8 -  23 mg/dL 68(H) 62(H) 65(H)  Creatinine 0.61 - 1.24 mg/dL 1.25(H) 1.30(H) 1.41(H)  Sodium 135 - 145 mmol/L 147(H) 148(H) 148(H)  Potassium 3.5 - 5.1 mmol/L 5.1 4.7 4.6  Chloride 98 - 111 mmol/L 94(L) 97(L) 99  CO2 22 - 32 mmol/L 39(H) 39(H) 38(H)  Calcium 8.9 - 10.3 mg/dL 9.1 9.0 8.9    BP 129/69   Pulse (!) 105   Temp 98.6 F (37 C)    Resp (!) 21   Ht _0  (1.778 m)   Wt 117.8 kg   SpO2 90%   BMI 37.26 kg/m    I/O last 3 completed shifts: In: 3980.1 [I.V.:883.9; NG/GT:3096.2] Out: 4110 [Urine:4110] Total I/O In: 10 [I.V.:10] Out: 245 [Urine:245]  SpO2: 90 % O2 Flow Rate (L/min): 6 L/min FiO2 (%): 40 %  Estimated body mass index is 37.26 kg/m as calculated from the following:   Height as of this encounter: _1  (1.778 m).   Weight as of this encounter: 117.8 kg.  SIGNIFICANT EVENTS   REVIEW OF SYSTEMS  PATIENT IS UNABLE TO PROVIDE COMPLETE REVIEW OF SYSTEMS DUE TO SEVERE CRITICAL ILLNESS       PHYSICAL EXAMINATION: GENERAL:critically ill appearing, +resp distress NECK: Supple. +ETT PULMONARY: +rhonchi CARDIOVASCULAR: S1 and S2.  GASTROINTESTINAL: Soft, nontender, +Positive bowel sounds.  MUSCULOSKELETAL: No swelling, clubbing, or edema.  NEUROLOGIC: obtunded, GCS<4T SKIN:intact,warm,dry     MEDICATIONS: I have reviewed all medications and confirmed regimen as documented   CULTURE RESULTS   Recent Results (from the past 240 hour(s))  SARS Coronavirus 2 by RT PCR (hospital order, performed in [facility] hospital lab) Nasopharyngeal Nasopharyngeal Swab     Status: None   Collection Time: 06/14/2020  9:36 PM   Specimen: Nasopharyngeal Swab  Result Value Ref Range Status   SARS Coronavirus 2 NEGATIVE NEGATIVE Final    Comment: (NOTE) SARS-CoV-2 target nucleic acids are NOT DETECTED.  The SARS-CoV-2 RNA is generally detectable in upper and lower respiratory specimens during the acute phase of infection. The lowest concentration of SARS-CoV-2 viral copies this assay can detect is 250 copies / mL. A negative result does not preclude SARS-CoV-2 infection and should not be used as the sole basis for treatment or other patient management decisions.  A negative result may occur with improper specimen collection / handling, submission of specimen other than nasopharyngeal swab, presence  of viral mutation(s) within the areas targeted by this assay, and inadequate number of viral copies (<250 copies / mL). A negative result must be combined with clinical observations, patient history, and epidemiological information.  Fact Sheet for Patients:   StrictlyIdeas.no  Fact Sheet for Healthcare Providers: BankingDealers.co.za  This test is not yet approved or  cleared by the [place] FDA and has been authorized for detection and/or diagnosis of SARS-CoV-2 by FDA under an Emergency Use Authorization (EUA).  This EUA will remain in effect (meaning this test can be used) for the duration of the COVID-19 declaration under Section 564(b)(1) of the Act, 21 U.S.C. section 360bbb-3(b)(1), unless the authorization is terminated or revoked sooner.  Performed at [facility], [address] [place], [place] [address]   MRSA PCR Screening     Status: None   Collection Time: 06/26/2020 11:39 PM   Specimen: Nasopharyngeal  Result Value Ref Range Status   MRSA by PCR NEGATIVE NEGATIVE Final    Comment:        The GeneXpert MRSA Assay (FDA approved for NASAL specimens only), is one  component of a comprehensive MRSA colonization surveillance program. It is not intended to diagnose MRSA infection nor to guide or monitor treatment for MRSA infections. Performed at [facility], [address] [place], [place] [address]   Culture, respiratory (non-expectorated)     Status: None   Collection Time: 07/01/20 10:50 AM   Specimen: Tracheal Aspirate; Respiratory  Result Value Ref Range Status   Specimen Description   Final    TRACHEAL ASPIRATE Performed at [facility], [address] [place], [place] [address]    Special Requests   Final    NONE Performed at [facility], [address] [place], [place] [address]    Gram Stain   Final    FEW WBC PRESENT, PREDOMINANTLY PMN MODERATE GRAM  POSITIVE COCCI MODERATE GRAM POSITIVE RODS    Culture   Final    FEW Normal respiratory flora-no Staph aureus or Pseudomonas seen Performed at [facility] [address] [place], [place] [address]    Report Status 07/04/2020 FINAL  Final          IMAGING    DG Chest Port 1 View  Result Date: 07/04/2020 CLINICAL DATA:  Acute respiratory failure with hypoxia EXAM: PORTABLE CHEST 1 VIEW COMPARISON:  07/02/2020 FINDINGS: Support devices are stable. Low lung volumes. Small bilateral effusions. Bibasilar opacities, likely atelectasis, slightly worsened on the left and improved on the right. Heart is upper limits normal in size. IMPRESSION: Small bilateral effusions. Slight improved aeration at the right base with increasing opacity at the left base, likely atelectasis. Electronically Signed   By: Rolm Baptise M.D.   On: 07/04/2020 01:02     Nutrition Status: Nutrition Problem: Inadequate oral intake Etiology: inability to eat Signs/Symptoms: NPO status Interventions: Tube feeding,Prostat     Indwelling Urinary Catheter continued, requirement due to   Reason to continue Indwelling Urinary Catheter strict Intake/Output monitoring for hemodynamic instability   Central Line/ continued, requirement due to  Reason to continue [place] of central venous pressure or other hemodynamic parameters and poor IV access   Ventilator continued, requirement due to severe respiratory failure   Ventilator Sedation RASS 0 to -2      ASSESSMENT AND PLAN SYNOPSIS   Severe ACUTE Hypoxic and Hypercapnic Respiratory Failuredue to severe COPD exacerbation with previous COVID 19 infection and morbid obesity   Severe ACUTE on chronic Hypoxic and Hypercapnic Respiratory Failure -continue Full MV support -continue Bronchodilator Therapy -Wean Fio2 and PEEP as tolerated -will perform SAT/SBT when respiratory parameters are met -VAP/VENT bundle implementation             -PFT 2019 - There is a severe reduction of the FEV1 and FVC. The diffusing capacity is moderately reduced. There is no significant response to bronchodilators.            -History of pulmonary asbestosis seen by [facility] in previously  and advanced COPD     Morbid obesity, possible OSA.   Will certainly impact respiratory mechanics, ventilator weaning Suspect will need to consider additional PEEP   ACUTE KIDNEY INJURY/Renal Failure -continue Foley Catheter-assess need -Avoid nephrotoxic agents -Follow urine output, BMP -Ensure adequate renal perfusion, optimize oxygenation -Renal dose medications   GI GI PROPHYLAXIS as indicated   DIET-->TF's as tolerated Constipation protocol as indicated  ENDO - will use ICU hypoglycemic\Hyperglycemia protocol if indicated    ELECTROLYTES -follow labs as needed -replace as needed -pharmacy consultation and following   DVT/GI PRX ordered  and assessed TRANSFUSIONS AS NEEDED MONITOR FSBS I Assessed the need for Labs I Assessed the need for Foley I Assessed the need for Central Venous Line Family Discussion when available I Assessed the need for Mobilization I made an Assessment of medications to be adjusted accordingly Safety Risk assessment completed   CASE DISCUSSED IN MULTIDISCIPLINARY ROUNDS WITH ICU TEAM  Critical Care Time devoted to patient care services described in this note is 33 minutes.   Overall, patient is critically ill, prognosis is guarded.  Patient with Multiorgan failure and at high risk for cardiac arrest and death.   Overall, patient is critically ill, prognosis is guarded.  Patient with Multiorgan failure and at high risk for cardiac arrest and death.   patient will likely need trach/peg tube    Ottie Glazier, M.D.  Pulmonary & [place]

## 2020-07-04 NOTE — Consult Note (Signed)
PHARMACY CONSULT NOTE  Pharmacy Consult for Electrolyte Monitoring and Replacement   Recent Labs: Potassium (mmol/L)  Date Value  07/04/2020 5.1   Magnesium (mg/dL)  Date Value  16/96/7893 2.8 (H)   Calcium (mg/dL)  Date Value  81/05/7508 9.1   Albumin (g/dL)  Date Value  25/85/2778 3.5   Phosphorus (mg/dL)  Date Value  24/23/5361 3.4   Sodium (mmol/L)  Date Value  07/04/2020 147 (H)     Assessment: 75 y/o male presented on 1/29 with altered mental status and SOB requiring emergency intubation in ED. Pharmacy has been consulted for electrolyte management.  On Lasix 40 mg IV daily On free water 200 mL q4H.   Renal function now improving.  Goal of Therapy:  Electrolytes WNL  Plan:  --No electrolyte replacement warranted today --Continue to follow along  Ronnald Ramp, PharmD 07/04/2020 7:21 AM

## 2020-07-05 LAB — CBC WITH DIFFERENTIAL/PLATELET
Abs Immature Granulocytes: 0.04 10*3/uL (ref 0.00–0.07)
Basophils Absolute: 0 10*3/uL (ref 0.0–0.1)
Basophils Relative: 0 %
Eosinophils Absolute: 0 10*3/uL (ref 0.0–0.5)
Eosinophils Relative: 0 %
HCT: 43 % (ref 39.0–52.0)
Hemoglobin: 13.2 g/dL (ref 13.0–17.0)
Immature Granulocytes: 0 %
Lymphocytes Relative: 7 %
Lymphs Abs: 0.9 10*3/uL (ref 0.7–4.0)
MCH: 28.4 pg (ref 26.0–34.0)
MCHC: 30.7 g/dL (ref 30.0–36.0)
MCV: 92.7 fL (ref 80.0–100.0)
Monocytes Absolute: 1.2 10*3/uL — ABNORMAL HIGH (ref 0.1–1.0)
Monocytes Relative: 10 %
Neutro Abs: 9.7 10*3/uL — ABNORMAL HIGH (ref 1.7–7.7)
Neutrophils Relative %: 83 %
Platelets: 243 10*3/uL (ref 150–400)
RBC: 4.64 MIL/uL (ref 4.22–5.81)
RDW: 14.6 % (ref 11.5–15.5)
WBC: 11.8 10*3/uL — ABNORMAL HIGH (ref 4.0–10.5)
nRBC: 0 % (ref 0.0–0.2)

## 2020-07-05 LAB — MAGNESIUM: Magnesium: 2.3 mg/dL (ref 1.7–2.4)

## 2020-07-05 LAB — GLUCOSE, CAPILLARY
Glucose-Capillary: 131 mg/dL — ABNORMAL HIGH (ref 70–99)
Glucose-Capillary: 140 mg/dL — ABNORMAL HIGH (ref 70–99)
Glucose-Capillary: 148 mg/dL — ABNORMAL HIGH (ref 70–99)
Glucose-Capillary: 149 mg/dL — ABNORMAL HIGH (ref 70–99)
Glucose-Capillary: 150 mg/dL — ABNORMAL HIGH (ref 70–99)
Glucose-Capillary: 169 mg/dL — ABNORMAL HIGH (ref 70–99)

## 2020-07-05 LAB — BASIC METABOLIC PANEL
Anion gap: 14 (ref 5–15)
BUN: 72 mg/dL — ABNORMAL HIGH (ref 8–23)
CO2: 40 mmol/L — ABNORMAL HIGH (ref 22–32)
Calcium: 9.4 mg/dL (ref 8.9–10.3)
Chloride: 93 mmol/L — ABNORMAL LOW (ref 98–111)
Creatinine, Ser: 1.32 mg/dL — ABNORMAL HIGH (ref 0.61–1.24)
GFR, Estimated: 56 mL/min — ABNORMAL LOW (ref >60)
Glucose, Bld: 141 mg/dL — ABNORMAL HIGH (ref 70–99)
Potassium: 3.7 mmol/L (ref 3.5–5.1)
Sodium: 147 mmol/L — ABNORMAL HIGH (ref 135–145)

## 2020-07-05 LAB — PHOSPHORUS: Phosphorus: 4 mg/dL (ref 2.5–4.6)

## 2020-07-05 NOTE — Consult Note (Signed)
PHARMACY CONSULT NOTE  Pharmacy Consult for Electrolyte Monitoring and Replacement   Recent Labs: Potassium (mmol/L)  Date Value  07/05/2020 3.7   Magnesium (mg/dL)  Date Value  38/93/7342 2.3   Calcium (mg/dL)  Date Value  87/68/1157 9.4   Albumin (g/dL)  Date Value  26/20/3559 3.5   Phosphorus (mg/dL)  Date Value  74/16/3845 4.0   Sodium (mmol/L)  Date Value  07/05/2020 147 (H)     Assessment: 75 y/o male presented on 1/29 with altered mental status and SOB requiring emergency intubation in ED. Pharmacy has been consulted for electrolyte management.  On Lasix 40 mg IV daily On free water 200 mL q4H.   Renal function now improving.  Goal of Therapy:  Electrolytes WNL  Plan:  --No electrolyte replacement warranted today --Continue to follow along  Ronnald Ramp, PharmD 07/05/2020 11:08 AM

## 2020-07-05 NOTE — Progress Notes (Signed)
CRITICAL CARE NOTE 75 year old male post COVID-41 infection 06/05/2020 presenting to the ED with 2 days of cough and shortness of breath at home after discharge from the [place]. patient developed acute hypercapnic respiratory failure, becoming obtunded requiring rapid sequence intubation and mechanical ventilation admitted to ICU.  Recent COVID-19 infection on 06/05/2020 with hospitalization at the [place].  Upon discharge from the [place] patient was prescribed 3 L nasal cannula of oxygen to be worn around the clock at home. Per wife's report patient had a repeat Covid test that was -2 weeks ago.   Patient reported to the ED physician upon arrival that on the morning of 06/10/2020 he checked his SPO2 at home and it was 82% on 3 L and came to the ED to be evaluated. He denied chest pain/recent fever.  ED course: D-dimer elevated >>CTA negative for PE &troponins flat at 10 x2. Due to concern for COPD exacerbation, patient received 2 duo nebs and a dose of Solu-Medrol.  Additional work-up performed for acute encephalopathy with intermittent "jerks" causing concern for possible seizures activity including bilateral lower extremity ultrasound negative for DVT, consultation with neurology and Keppra administration. ABG revealed PCO2 at 148, PCCM stat consulted for possible need of emergent intubation and mechanical ventilation due to obtunded LOC.    SUBJECTIVE    06/12/2020-admitted by [facility], became obtunded while rooming in the ED, PCO2 148, requiring RSI and mechanical ventilation. Admitted to the ICU 1/31 severe resp failure, failed weaning trials 2/1 severe resp failure 2/2 failed multiple weaning trials, severe resp failure 2/04- patient is critically ill, I met with family today and they had asked about prognosis which I explained is poor. I have allowed 1 person at a time to come in.  Family is considering comfort care vs possibly tracheostomy.  07/04/2020- Patient on 40% FiO2, spoke with wife, shes is  discussing goals of care with children and will speak with Palliative and PCCM team after.  07/05/2020- patient had awakening trial today, he was unable to follow any commands.  Met with family today.     Past Medical History:  COPD -3 L nasal cannula chronic O2 Hypertension Asbestos exposure Hyperlipidemia Obesity Former smoker    Procedures:  06/06/2020 ETT >>  Significant Diagnostic Tests:  06/28/2020 CTa chest>>no acute findings, negative for PE, no evidence of pneumonia or pulmonary edema. Pleural plaques bilaterally compatible with sequela of asbestos exposure. Asymmetrically prominent pleural thickening in the right upper lobe.  06/05/2020 [place] BLE>>negative for DVT 06/23/2020 CT head>>sinusitis primarily on the right maxillary antrum, no other abnormality Micro Data:  06/01/2020 COVID-19 >>NEGATIVE  Antimicrobials:  05/31/2020 azithromycin >>    CC  follow up respiratory failure  SUBJECTIVE Patient remains critically ill Prognosis is guarded  Vent Mode: PSV FiO2 (%):  [40 %] 40 % Set Rate:  [14 bmp] 14 bmp Vt Set:  [500 mL] 500 mL PEEP:  [5 cmH20] 5 cmH20 Pressure Support:  [5 cmH20-10 cmH20] 5 cmH20 Plateau Pressure:  [14 cmH20] 14 cmH20 CBC    Component Value Date/Time   WBC 11.8 (H) 07/05/2020 1006   RBC 4.64 07/05/2020 1006   HGB 13.2 07/05/2020 1006   HCT 43.0 07/05/2020 1006   PLT 243 07/05/2020 1006   MCV 92.7 07/05/2020 1006   MCH 28.4 07/05/2020 1006   MCHC 30.7 07/05/2020 1006   RDW 14.6 07/05/2020 1006   LYMPHSABS 0.9 07/05/2020 1006   MONOABS 1.2 (H) 07/05/2020 1006   EOSABS 0.0 07/05/2020 1006   BASOSABS 0.0 07/05/2020  1006   BMP Latest Ref Rng & Units 07/05/2020 07/04/2020 07/03/2020  Glucose 70 - 99 mg/dL 141(H) 143(H) 148(H)  BUN 8 - 23 mg/dL 72(H) 68(H) 62(H)  Creatinine 0.61 - 1.24 mg/dL 1.32(H) 1.25(H) 1.30(H)  Sodium 135 - 145 mmol/L 147(H) 147(H) 148(H)  Potassium 3.5 - 5.1 mmol/L 3.7 5.1 4.7  Chloride 98 - 111 mmol/L  93(L) 94(L) 97(L)  CO2 22 - 32 mmol/L 40(H) 39(H) 39(H)  Calcium 8.9 - 10.3 mg/dL 9.4 9.1 9.0    BP (!) 165/89   Pulse (!) 116   Temp 98.06 F (36.7 C)   Resp 20   Ht '5\' 10"'  (1.778 m)   Wt 114.1 kg   SpO2 90%   BMI 36.09 kg/m    I/O last 3 completed shifts: In: 3748.7 [I.V.:570.3; NG/GT:3178.3] Out: 4195 [Urine:4195] Total I/O In: 519.1 [I.V.:169.1; NG/GT:350] Out: 1525 [Urine:1525]  SpO2: 90 % O2 Flow Rate (L/min): 6 L/min FiO2 (%): 40 %  Estimated body mass index is 36.09 kg/m as calculated from the following:   Height as of this encounter: '5\' 10"'  (1.778 m).   Weight as of this encounter: 114.1 kg.  SIGNIFICANT EVENTS   REVIEW OF SYSTEMS  PATIENT IS UNABLE TO PROVIDE COMPLETE REVIEW OF SYSTEMS DUE TO SEVERE CRITICAL ILLNESS       PHYSICAL EXAMINATION: GENERAL:critically ill appearing, +resp distress NECK: Supple. +ETT PULMONARY: +rhonchi CARDIOVASCULAR: S1 and S2.  GASTROINTESTINAL: Soft, nontender, +Positive bowel sounds.  MUSCULOSKELETAL: No swelling, clubbing, or edema.  NEUROLOGIC: obtunded, GCS<4T SKIN:intact,warm,dry     MEDICATIONS: I have reviewed all medications and confirmed regimen as documented   CULTURE RESULTS   Recent Results (from the past 240 hour(s))  SARS Coronavirus 2 by RT PCR (hospital order, performed in [facility] hospital lab) Nasopharyngeal Nasopharyngeal Swab     Status: None   Collection Time: 06/05/2020  9:36 PM   Specimen: Nasopharyngeal Swab  Result Value Ref Range Status   SARS Coronavirus 2 NEGATIVE NEGATIVE Final    Comment: (NOTE) SARS-CoV-2 target nucleic acids are NOT DETECTED.  The SARS-CoV-2 RNA is generally detectable in upper and lower respiratory specimens during the acute phase of infection. The lowest concentration of SARS-CoV-2 viral copies this assay can detect is 250 copies / mL. A negative result does not preclude SARS-CoV-2 infection and should not be used as the sole basis for treatment or  other patient management decisions.  A negative result may occur with improper specimen collection / handling, submission of specimen other than nasopharyngeal swab, presence of viral mutation(s) within the areas targeted by this assay, and inadequate number of viral copies (<250 copies / mL). A negative result must be combined with clinical observations, patient history, and epidemiological information.  Fact Sheet for Patients:   StrictlyIdeas.no  Fact Sheet for Healthcare Providers: BankingDealers.co.za  This test is not yet approved or  cleared by the [place] FDA and has been authorized for detection and/or diagnosis of SARS-CoV-2 by FDA under an Emergency Use Authorization (EUA).  This EUA will remain in effect (meaning this test can be used) for the duration of the COVID-19 declaration under Section 564(b)(1) of the Act, 21 U.S.C. section 360bbb-3(b)(1), unless the authorization is terminated or revoked sooner.  Performed at [facility], [address] [place], [place] [address]   MRSA PCR Screening     Status: None   Collection Time: 06/28/2020 11:39 PM   Specimen: Nasopharyngeal  Result Value Ref Range Status   MRSA by PCR  NEGATIVE NEGATIVE Final    Comment:        The GeneXpert MRSA Assay (FDA approved for NASAL specimens only), is one component of a comprehensive MRSA colonization surveillance program. It is not intended to diagnose MRSA infection nor to guide or monitor treatment for MRSA infections. Performed at [facility], [address] [place], [place] [address]   Culture, respiratory (non-expectorated)     Status: None   Collection Time: 07/01/20 10:50 AM   Specimen: Tracheal Aspirate; Respiratory  Result Value Ref Range Status   Specimen Description   Final    TRACHEAL ASPIRATE Performed at [facility], [address], [place], [place] [address]    Special Requests    Final    NONE Performed at [facility], [address] [place], [place] [address]    Gram Stain   Final    FEW WBC PRESENT, PREDOMINANTLY PMN MODERATE GRAM POSITIVE COCCI MODERATE GRAM POSITIVE RODS    Culture   Final    FEW Normal respiratory flora-no Staph aureus or Pseudomonas seen Performed at [facility] [address] [place], [place] [address]    Report Status 07/04/2020 FINAL  Final          IMAGING    No results found.   Nutrition Status: Nutrition Problem: Inadequate oral intake Etiology: inability to eat Signs/Symptoms: NPO status Interventions: Tube feeding,Prostat     Indwelling Urinary Catheter continued, requirement due to   Reason to continue Indwelling Urinary Catheter strict Intake/Output monitoring for hemodynamic instability   Central Line/ continued, requirement due to  Reason to continue [place] of central venous pressure or other hemodynamic parameters and poor IV access   Ventilator continued, requirement due to severe respiratory failure   Ventilator Sedation RASS 0 to -2      ASSESSMENT AND PLAN SYNOPSIS   Severe ACUTE Hypoxic and Hypercapnic Respiratory Failuredue to severe COPD exacerbation with previous COVID 19 infection and morbid obesity   Severe ACUTE on chronic Hypoxic and Hypercapnic Respiratory Failure -continue Full MV support -continue Bronchodilator Therapy -Wean Fio2 and PEEP as tolerated -will perform SAT/SBT when respiratory parameters are met -VAP/VENT bundle implementation            -PFT 2019 - There is a severe reduction of the FEV1 and FVC. The diffusing capacity is moderately reduced. There is no significant response to bronchodilators.            -History of pulmonary asbestosis seen by [facility] in previously  and advanced COPD    -07/05/2020- patient failed SBT today due to heavy secretions and inability to follow any commands.  Discussed with family today.        Morbid obesity, possible OSA.   Will certainly impact respiratory mechanics, ventilator weaning Suspect will need to consider additional PEEP   ACUTE KIDNEY INJURY/Renal Failure -continue Foley Catheter-assess need -Avoid nephrotoxic agents -Follow urine output, BMP -Ensure adequate renal perfusion, optimize oxygenation -Renal dose medications   GI GI PROPHYLAXIS as indicated   DIET-->TF's as tolerated Constipation protocol as indicated  ENDO - will use ICU hypoglycemic\Hyperglycemia protocol if indicated    ELECTROLYTES -follow labs as needed -replace as needed -pharmacy consultation and following   DVT/GI PRX ordered and assessed TRANSFUSIONS AS NEEDED MONITOR FSBS I Assessed the need for Labs I Assessed the need for Foley I Assessed the need for Central Venous Line Family Discussion when available I Assessed the need for Mobilization I made  an Assessment of medications to be adjusted accordingly Safety Risk assessment completed   CASE DISCUSSED IN MULTIDISCIPLINARY ROUNDS WITH ICU TEAM  Critical Care Time devoted to patient care services described in this note is 33 minutes.   Overall, patient is critically ill, prognosis is guarded.  Patient with Multiorgan failure and at high risk for cardiac arrest and death.   Overall, patient is critically ill, prognosis is guarded.  Patient with Multiorgan failure and at high risk for cardiac arrest and death.   patient will likely need trach/peg tube    Ottie Glazier, M.D.  Pulmonary & [place]

## 2020-07-06 DIAGNOSIS — Z515 Encounter for palliative care: Secondary | ICD-10-CM | POA: Diagnosis not present

## 2020-07-06 DIAGNOSIS — J441 Chronic obstructive pulmonary disease with (acute) exacerbation: Secondary | ICD-10-CM | POA: Diagnosis not present

## 2020-07-06 DIAGNOSIS — Z7189 Other specified counseling: Secondary | ICD-10-CM | POA: Diagnosis not present

## 2020-07-06 LAB — CBC WITH DIFFERENTIAL/PLATELET
Abs Immature Granulocytes: 0.07 10*3/uL (ref 0.00–0.07)
Basophils Absolute: 0 10*3/uL (ref 0.0–0.1)
Basophils Relative: 0 %
Eosinophils Absolute: 0.1 10*3/uL (ref 0.0–0.5)
Eosinophils Relative: 1 %
HCT: 42.7 % (ref 39.0–52.0)
Hemoglobin: 13 g/dL (ref 13.0–17.0)
Immature Granulocytes: 1 %
Lymphocytes Relative: 8 %
Lymphs Abs: 1 10*3/uL (ref 0.7–4.0)
MCH: 28.4 pg (ref 26.0–34.0)
MCHC: 30.4 g/dL (ref 30.0–36.0)
MCV: 93.4 fL (ref 80.0–100.0)
Monocytes Absolute: 1.7 10*3/uL — ABNORMAL HIGH (ref 0.1–1.0)
Monocytes Relative: 14 %
Neutro Abs: 9.5 10*3/uL — ABNORMAL HIGH (ref 1.7–7.7)
Neutrophils Relative %: 76 %
Platelets: 247 10*3/uL (ref 150–400)
RBC: 4.57 MIL/uL (ref 4.22–5.81)
RDW: 14.7 % (ref 11.5–15.5)
WBC: 12.3 10*3/uL — ABNORMAL HIGH (ref 4.0–10.5)
nRBC: 0 % (ref 0.0–0.2)

## 2020-07-06 LAB — GLUCOSE, CAPILLARY
Glucose-Capillary: 135 mg/dL — ABNORMAL HIGH (ref 70–99)
Glucose-Capillary: 158 mg/dL — ABNORMAL HIGH (ref 70–99)
Glucose-Capillary: 171 mg/dL — ABNORMAL HIGH (ref 70–99)

## 2020-07-06 LAB — BASIC METABOLIC PANEL
Anion gap: 16 — ABNORMAL HIGH (ref 5–15)
BUN: 69 mg/dL — ABNORMAL HIGH (ref 8–23)
CO2: 39 mmol/L — ABNORMAL HIGH (ref 22–32)
Calcium: 9.2 mg/dL (ref 8.9–10.3)
Chloride: 94 mmol/L — ABNORMAL LOW (ref 98–111)
Creatinine, Ser: 1.44 mg/dL — ABNORMAL HIGH (ref 0.61–1.24)
GFR, Estimated: 50 mL/min — ABNORMAL LOW (ref >60)
Glucose, Bld: 146 mg/dL — ABNORMAL HIGH (ref 70–99)
Potassium: 3.4 mmol/L — ABNORMAL LOW (ref 3.5–5.1)
Sodium: 149 mmol/L — ABNORMAL HIGH (ref 135–145)

## 2020-07-06 LAB — MAGNESIUM: Magnesium: 2.5 mg/dL — ABNORMAL HIGH (ref 1.7–2.4)

## 2020-07-06 LAB — PHOSPHORUS: Phosphorus: 3.4 mg/dL (ref 2.5–4.6)

## 2020-07-06 MED ORDER — VITAL 1.5 CAL PO LIQD
1000.0000 mL | ORAL | Status: DC
  Administered 2020-07-06 – 2020-07-09 (×3): 1000 mL

## 2020-07-06 MED ORDER — PROSOURCE TF PO LIQD
45.0000 mL | Freq: Every day | ORAL | Status: DC
  Administered 2020-07-06 – 2020-07-10 (×18): 45 mL
  Filled 2020-07-06 (×22): qty 45

## 2020-07-06 MED ORDER — POTASSIUM CHLORIDE 20 MEQ PO PACK
40.0000 meq | PACK | Freq: Once | ORAL | Status: AC
  Administered 2020-07-06: 40 meq
  Filled 2020-07-06: qty 2

## 2020-07-06 NOTE — Progress Notes (Addendum)
                                                                                                                                                                                                         Daily Progress Note   Patient Name: Kristopher Blake       Date: 07/06/2020 DOB: 05/19/1946  Age: 74 y.o. MRN#: [id number] Attending Physician: Gonzalez, Carmen L, MD Primary Care Physician: Patient, No Pcp Per Admit Date: 06/25/2020  Reason for Consultation/Follow-up: Establishing goals of care  Subjective: Patient is resting in bed on ventilator.  Wife is at bedside, and we discussed his status and her wishes. She called her daughter in to bedside, and daughter requested an additional family member.  Wife states that the family has decided to proceed with the tracheostomy.  She and family states that the family was advised that he would be able to eat and drink normally, speak, and come home with a tracheostomy.  Discussed that the patient continues to require ventilator support, and would need to wean from the ventilator; he would need to live in a facility while he is reliant on ventilator support.  Discussed that he would need a feeding tube during this time as well.  Family states that they were under the impression that he would be coming straight home with a tracheostomy, and states that he would not want to live in a facility for any period of time.  I requested CCM to come in and speak with family and answer questions.  CCM in to speak with family during my meeting; discussed his poor prognosis,and poor prospect of weaning from a ventilator.  Discussed quality of life that would be acceptable to the patient.  Family states he would not want to have a feeding tube or to go to a facility.  They state that he is an organ donor.  Questions answered in regards to stopping life-prolonging care and proceeding with comfort focused care.  They will talk this evening as a family.   Length of Stay:  9  Current Medications: Scheduled Meds:  . vitamin C  500 mg Per Tube Daily  . budesonide (PULMICORT) nebulizer solution  0.5 mg Nebulization BID  . chlorhexidine gluconate (MEDLINE KIT)  15 mL Mouth Rinse BID  . Chlorhexidine Gluconate Cloth  6 each Topical Daily  . docusate  100 mg Per Tube BID  . enoxaparin (LOVENOX) injection  0.5 mg/kg Subcutaneous Q24H  . famotidine  20 mg Per Tube Daily  . feeding supplement (PROSource TF)  45 mL   Per Tube 5 X Daily  . free water  200 mL Per Tube Q4H  . furosemide  40 mg Intravenous Daily  . ipratropium-albuterol  3 mL Nebulization Q6H  . lactulose  10 g Per Tube BID  . mouth rinse  15 mL Mouth Rinse 10 times per day  . methylPREDNISolone (SOLU-MEDROL) injection  40 mg Intravenous Q12H  . polyethylene glycol  17 g Per Tube Daily  . QUEtiapine  50 mg Per Tube BID  . sodium chloride flush  10-40 mL Intracatheter Q12H  . zinc sulfate  220 mg Per Tube Daily    Continuous Infusions: . sodium chloride    . feeding supplement (VITAL 1.5 CAL) 1,000 mL (07/06/20 1452)  . fentaNYL infusion INTRAVENOUS Stopped (07/05/20 1115)  . propofol (DIPRIVAN) infusion Stopped (07/04/20 1704)    PRN Meds: acetaminophen, albuterol, fentaNYL, hydrALAZINE, sodium chloride flush  Physical Exam Constitutional:      Comments: On the ventilator.             Vital Signs: BP 127/73   Pulse (!) 114   Temp 99.86 F (37.7 C)   Resp (!) 25   Ht 5' 10" (1.778 m)   Wt 114.1 kg   SpO2 100%   BMI 36.09 kg/m  SpO2: SpO2: 100 % O2 Device: O2 Device: Ventilator O2 Flow Rate: O2 Flow Rate (L/min): 6 L/min  Intake/output summary:   Intake/Output Summary (Last 24 hours) at 07/06/2020 1510 Last data filed at 07/06/2020 1300 Gross per 24 hour  Intake 462.47 ml  Output 2525 ml  Net -2062.53 ml   LBM: Last BM Date: 07/06/20 Baseline Weight: Weight: 122.5 kg Most recent weight: Weight: 114.1 kg         Flowsheet Rows   Flowsheet Row Most Recent Value  Intake  Tab   Referral Department Critical care  Unit at Time of Referral ICU  Palliative Care Primary Diagnosis Pulmonary  Date Notified 06/29/20  Palliative Care Type New Palliative care  Reason for referral Clarify Goals of Care  Date of Admission 06/05/2020  Date first seen by Palliative Care 06/29/20  # of days Palliative referral response time 0 Day(s)  # of days IP prior to Palliative referral 2  Clinical Assessment   Psychosocial & Spiritual Assessment   Palliative Care Outcomes       Patient Active Problem List   Diagnosis Date Noted  . COPD exacerbation (HCC) 06/02/2020  . Acute on chronic respiratory failure with hypoxia (HCC) 06/23/2020  . COVID-19 virus infection 06/24/2020  . Hypokalemia 06/18/2020  . Acute metabolic encephalopathy 06/29/2020  . Acute respiratory failure (HCC) 05/30/2020  . Pain due to onychomycosis of toenails of both feet 12/03/2018  . COPD (chronic obstructive pulmonary disease) (HCC) 11/28/2017  . H/O asbestos exposure 11/28/2017  . HLD (hyperlipidemia) 11/28/2017  . HTN (hypertension) 11/28/2017  . Obesity, unspecified 11/28/2017  . Former cigarette smoker 06/05/2014    Palliative Care Assessment & Plan    Recommendations/Plan: Family discussing comfort focused care.    Code Status:    Code Status Orders  (From admission, onward)         Start     Ordered   07/02/20 1538  Do not attempt resuscitation (DNR)  Continuous       Question Answer Comment  In the event of cardiac or respiratory ARREST Do not call a "code blue"   In the event of cardiac or respiratory ARREST Do not perform Intubation, CPR, defibrillation or ACLS     In the event of cardiac or respiratory ARREST Use medication by any route, position, wound care, and other measures to relive pain and suffering. May use oxygen, suction and manual treatment of airway obstruction as needed for comfort.      07/02/20 1537        Code Status History    Date Active Date Inactive  Code Status Order ID Comments User Context   05/30/2020 2001 07/02/2020 1537 Full Code [id number]  Rust-Chester, Britton L, NP ED   06/18/2020 1500 06/11/2020 2001 Full Code [id number]  Niu, Xilin, MD ED   Advance Care Planning Activity      Prognosis:  < 6 months  Care plan was discussed with CCM and primary nurse  Thank you for allowing the Palliative Medicine Team to assist in the care of this patient.   Total Time 35 min Prolonged Time Billed  no      Greater than 50%  of this time was spent counseling and coordinating care related to the above assessment and plan.   , NP  Please contact Palliative Medicine Team phone at [phone] for questions and concerns.      

## 2020-07-06 NOTE — Progress Notes (Signed)
Have engaged this family a few days. Today they are very tearful. Wife is at bedside, have checked with them multiple times to see if there were any needs.

## 2020-07-06 NOTE — Progress Notes (Signed)
Flexi-seal put out 100 stool overnight. Foley put out 1.2 L. Pt remains raas -1. Pt remains on Fent gtt only.

## 2020-07-06 NOTE — Consult Note (Signed)
PHARMACY CONSULT NOTE  Pharmacy Consult for Electrolyte Monitoring and Replacement   Recent Labs: Potassium (mmol/L)  Date Value  07/06/2020 3.4 (L)   Magnesium (mg/dL)  Date Value  72/89/7915 2.5 (H)   Calcium (mg/dL)  Date Value  09/09/6436 9.2   Albumin (g/dL)  Date Value  37/79/3968 3.5   Phosphorus (mg/dL)  Date Value  86/48/4720 3.4   Sodium (mmol/L)  Date Value  07/06/2020 149 (H)     Assessment: 75 y/o male presented on 1/29 with altered mental status and SOB requiring emergency intubation in ED. Pharmacy has been consulted for electrolyte management.  On Lasix 40 mg IV daily On free water 200 mL q4H.   Goal of Therapy:  Electrolytes WNL  Plan:  --Potassium 40 mEq per tube x 1 dose --Continue to follow along  Pricilla Riffle, PharmD 07/06/2020 1:01 PM

## 2020-07-06 NOTE — Progress Notes (Signed)
Nutrition Follow Up Note   DOCUMENTATION CODES:   Obesity unspecified  INTERVENTION:   Increase Vital 1.5 Cal to 60 mL/hr (1440 mL goal daily volume) + PROSource TF 45 mL 5 times daily per tube.   Free water flushes 284m q4 hours per MD  Regimen provides 2360kcal/day, 152 grams of protein, 2300 mL H2O daily.   NUTRITION DIAGNOSIS:   Inadequate oral intake related to inability to eat as evidenced by NPO status.  GOAL:   Patient will meet greater than or equal to 90% of their needs  -met   MONITOR:   Vent status,Labs,Weight trends,TF tolerance,I & O's  ASSESSMENT:   75year old male with PMHx of COPD, HTN, recent COVID-19 06/05/2020 admitted with AECOPD, acute encephalopathy.   1/29 intubated  Pt remains sedated and ventilated. OGT in place. Pt tolerating tube feeds well at goal rate; will adjust feeds in setting of COVID 19 guidelines. Family deciding about GOC versus trach.   Per chart, pt is down 19lbs since admit.   Medications reviewed and include: Vitamin C, colace, lovenox, pepcid, lasix, lactulose, solu-medrol, miralax, zinc  Labs reviewed: Na 149(H), K 3.4(L), BUN 69(H), creat 1.44(H), P 3.4 wnl, Mg 2.5(H) Wbc- 12.3(H) cbgs- 169, 148, 131, 150, 135 x 24 hrs  Patient is currently intubated on ventilator support MV: 11.0 L/min Temp (24hrs), Avg:99.2 F (37.3 C), Min:97.88 F (36.6 C), Max:100.58 F (38.1 C)  Propofol: none  MAP- >662mg  UOP- [id number] Order:   Diet Order            Diet NPO time specified  Diet effective now                EDUCATION NEEDS:   No education needs have been identified at this time  Skin:  Skin Assessment: Reviewed RN Assessment  Last BM:  2/7- TYPE 7  Height:   Ht Readings from Last 1 Encounters:  06/02/2020 '5\' 10"'  (1.778 m)   Weight:   Wt Readings from Last 1 Encounters:  07/05/20 114.1 kg   Ideal Body Weight:  75.5 kg  BMI:  Body mass index is 36.09 kg/m.  Estimated Nutritional Needs:    Kcal:  2390kcal/day  Protein:  151 grams  Fluid:  >/= 2 L/day  CasKoleen Distance, RD, LDN Please refer to [facility] RD and/or RD on-call/weekend/after hours pager

## 2020-07-06 NOTE — Progress Notes (Addendum)
CRITICAL CARE NOTE  Patient background: 75 year old male post COVID-19 infection 06/05/2020 presenting to the ED with 2 days of cough and shortness of breath at home after discharge from the [place]. patient developed acute hypercapnic respiratory failure, becoming obtunded requiring rapid sequence intubation and mechanical ventilation admitted to ICU.  Recent COVID-19 infection on 06/05/2020 with hospitalization at the [place].  Upon discharge from the [place] patient was prescribed 3 L nasal cannula of oxygen to be worn around the clock at home. Per wife's report patient had a repeat Covid test that was -2 weeks ago.   Patient reported to the ED physician upon arrival that on the morning of 06/18/2020 he checked his SPO2 at home and it was 82% on 3 L and came to the ED to be evaluated. He denied chest pain/recent fever.  ED course: D-dimer elevated >>CTA negative for PE &troponins flat at 10 x2. Due to concern for COPD exacerbation, patient received 2 duo nebs and a dose of Solu-Medrol.  Additional work-up performed for acute encephalopathy with intermittent "jerks" causing concern for possible seizures activity including bilateral lower extremity ultrasound negative for DVT, consultation with neurology and Keppra administration. ABG revealed PCO2 at 148, PCCM stat consulted for possible need of emergent intubation and mechanical ventilation due to obtunded LOC.    SUBJECTIVE    06/08/2020-admitted by [facility], became obtunded while rooming in the ED, PCO2 148, requiring RSI and mechanical ventilation. Admitted to the ICU 1/31 severe resp failure, failed weaning trials 2/1 severe resp failure 2/2 failed multiple weaning trials, severe resp failure 2/04- patient is critically ill, I met with family today and they had asked about prognosis which I explained is poor. I have allowed 1 person at a time to come in.  Family is considering comfort care vs possibly tracheostomy.  07/04/2020- Patient on 40% FiO2,  spoke with wife, shes is discussing goals of care with children and will speak with Palliative and PCCM team after.  07/05/2020- patient had awakening trial today, he was unable to follow any commands.  Met with family today. 07/06/2020-ventilator asynchrony when sedation is lightened, readdress goals of care with patient's wife and daughter   Past Medical History:  COPD -3 L nasal cannula chronic O2 Hypertension Asbestos exposure Hyperlipidemia Obesity Former smoker   Procedures:  05/31/2020 ETT >> dislodged 07/02/2020 07/02/2020>> reintubated  Significant Diagnostic Tests:  06/26/2020 CTa chest>>no acute findings, negative for PE, no evidence of pneumonia or pulmonary edema. Pleural plaques bilaterally compatible with sequela of asbestos exposure. Asymmetrically prominent pleural thickening in the right upper lobe.  06/17/2020 [place] BLE>>negative for DVT 06/14/2020 CT head>>sinusitis primarily on the right maxillary antrum, no other abnormality Micro Data:  06/15/2020 COVID-19 >>NEGATIVE  Antimicrobials:  06/08/2020 azithromycin >> stopped   Medications Scheduled Meds: . vitamin C  500 mg Per Tube Daily  . budesonide (PULMICORT) nebulizer solution  0.5 mg Nebulization BID  . chlorhexidine gluconate (MEDLINE KIT)  15 mL Mouth Rinse BID  . Chlorhexidine Gluconate Cloth  6 each Topical Daily  . docusate  100 mg Per Tube BID  . enoxaparin (LOVENOX) injection  0.5 mg/kg Subcutaneous Q24H  . famotidine  20 mg Per Tube Daily  . feeding supplement (PROSource TF)  45 mL Per Tube 5 X Daily  . free water  200 mL Per Tube Q4H  . furosemide  40 mg Intravenous Daily  . ipratropium-albuterol  3 mL Nebulization Q6H  . lactulose  10 g Per Tube BID  . mouth rinse  15  mL Mouth Rinse 10 times per day  . methylPREDNISolone (SOLU-MEDROL) injection  40 mg Intravenous Q12H  . polyethylene glycol  17 g Per Tube Daily  . QUEtiapine  50 mg Per Tube BID  . sodium chloride flush  10-40 mL  Intracatheter Q12H  . zinc sulfate  220 mg Per Tube Daily   Continuous Infusions: . sodium chloride    . feeding supplement (VITAL 1.5 CAL) 1,000 mL (07/06/20 1452)  . fentaNYL infusion INTRAVENOUS 50 mcg/hr (07/06/20 1720)  . propofol (DIPRIVAN) infusion Stopped (07/04/20 1704)   PRN Meds:.acetaminophen, albuterol, fentaNYL, hydrALAZINE, sodium chloride flush   CC  follow up respiratory failure  SUBJECTIVE Patient remains critically ill Prognosis is guarded  Vent Mode: PRVC FiO2 (%):  [24 %-50 %] 24 % Set Rate:  [14 bmp] 14 bmp Vt Set:  [500 mL] 500 mL PEEP:  [5 cmH20] 5 cmH20 CBC    Component Value Date/Time   WBC 12.3 (H) 07/06/2020 0635   RBC 4.57 07/06/2020 0635   HGB 13.0 07/06/2020 0635   HCT 42.7 07/06/2020 0635   PLT 247 07/06/2020 0635   MCV 93.4 07/06/2020 0635   MCH 28.4 07/06/2020 0635   MCHC 30.4 07/06/2020 0635   RDW 14.7 07/06/2020 0635   LYMPHSABS 1.0 07/06/2020 0635   MONOABS 1.7 (H) 07/06/2020 0635   EOSABS 0.1 07/06/2020 0635   BASOSABS 0.0 07/06/2020 0635   BMP Latest Ref Rng & Units 07/06/2020 07/05/2020 07/04/2020  Glucose 70 - 99 mg/dL 146(H) 141(H) 143(H)  BUN 8 - 23 mg/dL 69(H) 72(H) 68(H)  Creatinine 0.61 - 1.24 mg/dL 1.44(H) 1.32(H) 1.25(H)  Sodium 135 - 145 mmol/L 149(H) 147(H) 147(H)  Potassium 3.5 - 5.1 mmol/L 3.4(L) 3.7 5.1  Chloride 98 - 111 mmol/L 94(L) 93(L) 94(L)  CO2 22 - 32 mmol/L 39(H) 40(H) 39(H)  Calcium 8.9 - 10.3 mg/dL 9.2 9.4 9.1    BP 134/76   Pulse (!) 108   Temp (!) 100.4 F (38 C)   Resp (!) 24   Ht 5' 10" (1.778 m)   Wt 114.1 kg   SpO2 90%   BMI 36.09 kg/m    I/O last 3 completed shifts: In: 981.6 [I.V.:175.8; NG/GT:805.8] Out: 4850 [Urine:4750; Stool:100] Total I/O In: -  Out: 325 [Urine:325]  SpO2: 90 % O2 Flow Rate (L/min): 6 L/min FiO2 (%): 24 %  Estimated body mass index is 36.09 kg/m as calculated from the following:   Height as of this encounter: 5' 10" (1.778 m).   Weight as of this  encounter: 114.1 kg.   REVIEW OF SYSTEMS Patient is unable to provide review of systems due to intubated status     GENERAL: Obese gentleman, unresponsive on vent.  Minimal sedation. HEAD: Normocephalic, atraumatic.  EYES: Pupils equal, round, reactive to light.  No scleral icterus.  MOUTH: Orotracheally intubated, OG in place. NECK: Supple. No thyromegaly. Trachea midline. No JVD.  No adenopathy. PULMONARY: Good air entry bilaterally.  Rhonchi throughout.   CARDIOVASCULAR: S1 and S2. Regular rate and rhythm.  No rubs, murmurs or gallops heard. ABDOMEN: Protuberant, soft, nondistended, normoactive bowel sounds, tolerating tube feeds MUSCULOSKELETAL: No joint deformity, no clubbing, no edema.  NEUROLOGIC: When sedation lightened, a synchronous with vent, not following commands. SKIN: Intact,warm,dry.   CULTURE RESULTS   Recent Results (from the past 240 hour(s))  SARS Coronavirus 2 by RT PCR (hospital order, performed in [facility] hospital lab) Nasopharyngeal Nasopharyngeal Swab     Status: None   Collection Time:  06/08/2020  9:36 PM   Specimen: Nasopharyngeal Swab  Result Value Ref Range Status   SARS Coronavirus 2 NEGATIVE NEGATIVE Final    Comment: (NOTE) SARS-CoV-2 target nucleic acids are NOT DETECTED.  The SARS-CoV-2 RNA is generally detectable in upper and lower respiratory specimens during the acute phase of infection. The lowest concentration of SARS-CoV-2 viral copies this assay can detect is 250 copies / mL. A negative result does not preclude SARS-CoV-2 infection and should not be used as the sole basis for treatment or other patient management decisions.  A negative result may occur with improper specimen collection / handling, submission of specimen other than nasopharyngeal swab, presence of viral mutation(s) within the areas targeted by this assay, and inadequate number of viral copies (<250 copies / mL). A negative result must be combined with  clinical observations, patient history, and epidemiological information.  Fact Sheet for Patients:   StrictlyIdeas.no  Fact Sheet for Healthcare Providers: BankingDealers.co.za  This test is not yet approved or  cleared by the [place] FDA and has been authorized for detection and/or diagnosis of SARS-CoV-2 by FDA under an Emergency Use Authorization (EUA).  This EUA will remain in effect (meaning this test can be used) for the duration of the COVID-19 declaration under Section 564(b)(1) of the Act, 21 U.S.C. section 360bbb-3(b)(1), unless the authorization is terminated or revoked sooner.  Performed at [facility], [address] [place], [place] [address]   MRSA PCR Screening     Status: None   Collection Time: 06/10/2020 11:39 PM   Specimen: Nasopharyngeal  Result Value Ref Range Status   MRSA by PCR NEGATIVE NEGATIVE Final    Comment:        The GeneXpert MRSA Assay (FDA approved for NASAL specimens only), is one component of a comprehensive MRSA colonization surveillance program. It is not intended to diagnose MRSA infection nor to guide or monitor treatment for MRSA infections. Performed at [facility], [address] [place], [place] [address]   Culture, respiratory (non-expectorated)     Status: None   Collection Time: 07/01/20 10:50 AM   Specimen: Tracheal Aspirate; Respiratory  Result Value Ref Range Status   Specimen Description   Final    TRACHEAL ASPIRATE Performed at [facility], [address] [place], [place] [address]    Special Requests   Final    NONE Performed at [facility], [address] [place], [place] [address]    Gram Stain   Final    FEW WBC PRESENT, PREDOMINANTLY PMN MODERATE GRAM POSITIVE COCCI MODERATE GRAM POSITIVE RODS    Culture   Final    FEW Normal respiratory flora-no Staph aureus or Pseudomonas seen Performed at [place] [address] [place], [place] [address]    Report Status 07/04/2020 FINAL  Final  Culture, respiratory (non-expectorated)     Status: None (Preliminary result)   Collection Time: 07/05/20  7:34 PM   Specimen: Tracheal Aspirate; Respiratory  Result Value Ref Range Status   Specimen Description   Final    TRACHEAL ASPIRATE Performed at [facility], [address] [place], [place] [address]    Special Requests   Final    NONE Performed at [facility], [address] [place], [place] [address]    Gram Stain   Final    FEW WBC PRESENT, PREDOMINANTLY PMN MODERATE GRAM POSITIVE COCCI IN PAIRS IN CHAINS RARE GRAM NEGATIVE RODS Performed at [facility], [place] [address] [place], [place] [address]    Culture PENDING  Incomplete   Report Status PENDING  Incomplete     Results for orders placed or performed during the hospital encounter of 06/11/2020 (from the past 24 hour(s))  Glucose, capillary     Status: Abnormal   Collection Time: 07/05/20 11:33 PM  Result Value Ref Range   Glucose-Capillary 150 (H) 70 - 99 mg/dL  CBC with Differential/Platelet     Status: Abnormal   Collection Time: 07/06/20  6:35 AM  Result Value Ref Range   WBC 12.3 (H) 4.0 - 10.5 K/uL   RBC 4.57 4.22 - 5.81 MIL/uL   Hemoglobin 13.0 13.0 - 17.0 g/dL   HCT 42.7 39.0 - 52.0 %   MCV 93.4 80.0 - 100.0 fL   MCH 28.4 26.0 - 34.0 pg   MCHC 30.4 30.0 - 36.0 g/dL   RDW 14.7 11.5 - 15.5 %   Platelets 247 150 - 400 K/uL   nRBC 0.0 0.0 - 0.2 %   Neutrophils Relative % 76 %   Neutro Abs 9.5 (H) 1.7 - 7.7 K/uL   Lymphocytes Relative 8 %   Lymphs Abs 1.0 0.7 - 4.0 K/uL   Monocytes Relative 14 %   Monocytes Absolute 1.7 (H) 0.1 - 1.0 K/uL   Eosinophils Relative 1 %   Eosinophils Absolute 0.1 0.0 - 0.5 K/uL   Basophils Relative 0 %   Basophils Absolute 0.0 0.0 - 0.1 K/uL   Immature Granulocytes 1 %   Abs Immature Granulocytes 0.07 0.00 - 0.07 K/uL  Basic metabolic panel      Status: Abnormal   Collection Time: 07/06/20  6:35 AM  Result Value Ref Range   Sodium 149 (H) 135 - 145 mmol/L   Potassium 3.4 (L) 3.5 - 5.1 mmol/L   Chloride 94 (L) 98 - 111 mmol/L   CO2 39 (H) 22 - 32 mmol/L   Glucose, Bld 146 (H) 70 - 99 mg/dL   BUN 69 (H) 8 - 23 mg/dL   Creatinine, Ser 1.44 (H) 0.61 - 1.24 mg/dL   Calcium 9.2 8.9 - 10.3 mg/dL   GFR, Estimated 50 (L) >60 mL/min   Anion gap 16 (H) 5 - 15  Magnesium     Status: Abnormal   Collection Time: 07/06/20  6:35 AM  Result Value Ref Range   Magnesium 2.5 (H) 1.7 - 2.4 mg/dL  Phosphorus     Status: None   Collection Time: 07/06/20  6:35 AM  Result Value Ref Range   Phosphorus 3.4 2.5 - 4.6 mg/dL  Glucose, capillary     Status: Abnormal   Collection Time: 07/06/20  7:35 AM  Result Value Ref Range   Glucose-Capillary 135 (H) 70 - 99 mg/dL  Glucose, capillary     Status: Abnormal   Collection Time: 07/06/20  7:13 PM  Result Value Ref Range   Glucose-Capillary 158 (H) 70 - 99 mg/dL        IMAGING    No results found.   Nutrition Status: Nutrition Problem: Inadequate oral intake Etiology: inability to eat Signs/Symptoms: NPO status Interventions: Tube feeding,Prostat     Indwelling Urinary Catheter continued, requirement due to   Reason to continue Indwelling Urinary Catheter strict Intake/Output monitoring for hemodynamic instability   Central Line/ continued, requirement due to  Reason to continue [place] of central venous pressure or other hemodynamic parameters and poor IV access   Ventilator continued, requirement due to severe  respiratory failure   Ventilator Sedation RASS 0 to -2      ASSESSMENT AND PLAN SYNOPSIS Patient presents with 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  Prior history of COVID-19 infection, morbid obesity   Severe ACUTE on chronic Hypoxic and Hypercapnic Respiratory Failure -continue Full MV  support -continue Bronchodilator Therapy -Wean Fio2 and PEEP as tolerated -will perform SAT/SBT when respiratory parameters are met -VAP/VENT bundle implementation       COPD Asbestosis These issues add complexity to his management PFT 2019 - There is a severe reduction of the FEV1 and FVC. The diffusing capacity is moderately reduced. There is no significant response to bronchodilators.  Spirometry more consistent with restriction in this setting bronchodilators will be of little use. History of pulmonary asbestosis seen by Annitta Jersey in previously  and advanced COPD Has been oxygen dependent   Morbid obesity, possible OSA.   Will certainly impact respiratory mechanics, ventilator weaning Suspect will need to consider additional PEEP   ACUTE KIDNEY INJURY/Renal Failure -continue Foley Catheter-assess need -Avoid nephrotoxic agents -Follow urine output, BMP -Ensure adequate renal perfusion, optimize oxygenation -Renal dose medications   GI GI PROPHYLAXIS as indicated   DIET-->TF's as tolerated Constipation protocol as indicated  ENDO - will use ICU hypoglycemic\Hyperglycemia protocol if indicated    ELECTROLYTES -follow labs as needed -replace as needed -pharmacy consultation and following   DVT/GI PRX ordered and assessed TRANSFUSIONS AS NEEDED MONITOR FSBS I Assessed the need for Labs I Assessed the need for Foley I Assessed the need for Central Venous Line Family Discussion when available I Assessed the need for Mobilization I made an Assessment of medications to be adjusted accordingly Safety Risk assessment completed   CASE DISCUSSED IN MULTIDISCIPLINARY ROUNDS WITH ICU TEAM  Critical Care Time devoted to patient care services described in this note is 45 minutes.   Overall, patient is critically ill, prognosis is guarded.  Patient with multiorgan failure and at high risk for cardiac arrest and death.   Discussed with patient's daughter and wife  as well as with palliative care, family considering trach/PEG versus transitioning to comfort care.  Renold Don, MD [place] [facility]   *This note was dictated using voice recognition software/Dragon.  Despite best efforts to proofread, errors can occur which can change the meaning.  Any change was purely unintentional.

## 2020-07-07 ENCOUNTER — Inpatient Hospital Stay: Payer: No Typology Code available for payment source

## 2020-07-07 DIAGNOSIS — Z7189 Other specified counseling: Secondary | ICD-10-CM | POA: Diagnosis not present

## 2020-07-07 DIAGNOSIS — Z515 Encounter for palliative care: Secondary | ICD-10-CM | POA: Diagnosis not present

## 2020-07-07 DIAGNOSIS — J441 Chronic obstructive pulmonary disease with (acute) exacerbation: Secondary | ICD-10-CM | POA: Diagnosis not present

## 2020-07-07 LAB — GLUCOSE, CAPILLARY
Glucose-Capillary: 156 mg/dL — ABNORMAL HIGH (ref 70–99)
Glucose-Capillary: 154 mg/dL — ABNORMAL HIGH (ref 70–99)
Glucose-Capillary: 185 mg/dL — ABNORMAL HIGH (ref 70–99)
Glucose-Capillary: 173 mg/dL — ABNORMAL HIGH (ref 70–99)
Glucose-Capillary: 156 mg/dL — ABNORMAL HIGH (ref 70–99)

## 2020-07-07 LAB — BASIC METABOLIC PANEL
Anion gap: 15 (ref 5–15)
BUN: 78 mg/dL — ABNORMAL HIGH (ref 8–23)
CO2: 37 mmol/L — ABNORMAL HIGH (ref 22–32)
Calcium: 9.1 mg/dL (ref 8.9–10.3)
Chloride: 98 mmol/L (ref 98–111)
Creatinine, Ser: 1.8 mg/dL — ABNORMAL HIGH (ref 0.61–1.24)
GFR, Estimated: 39 mL/min — ABNORMAL LOW (ref >60)
Glucose, Bld: 150 mg/dL — ABNORMAL HIGH (ref 70–99)
Potassium: 3.7 mmol/L (ref 3.5–5.1)
Sodium: 150 mmol/L — ABNORMAL HIGH (ref 135–145)

## 2020-07-07 LAB — CBC WITH DIFFERENTIAL/PLATELET
Abs Immature Granulocytes: 0.06 10*3/uL (ref 0.00–0.07)
Basophils Absolute: 0 10*3/uL (ref 0.0–0.1)
Basophils Relative: 0 %
Eosinophils Absolute: 0 10*3/uL (ref 0.0–0.5)
Eosinophils Relative: 0 %
HCT: 43.3 % (ref 39.0–52.0)
Hemoglobin: 13.3 g/dL (ref 13.0–17.0)
Immature Granulocytes: 0 %
Lymphocytes Relative: 5 %
Lymphs Abs: 0.7 10*3/uL (ref 0.7–4.0)
MCH: 28.5 pg (ref 26.0–34.0)
MCHC: 30.7 g/dL (ref 30.0–36.0)
MCV: 92.7 fL (ref 80.0–100.0)
Monocytes Absolute: 1.5 10*3/uL — ABNORMAL HIGH (ref 0.1–1.0)
Monocytes Relative: 10 %
Neutro Abs: 12.1 10*3/uL — ABNORMAL HIGH (ref 1.7–7.7)
Neutrophils Relative %: 85 %
Platelets: 248 10*3/uL (ref 150–400)
RBC: 4.67 MIL/uL (ref 4.22–5.81)
RDW: 15 % (ref 11.5–15.5)
WBC: 14.4 10*3/uL — ABNORMAL HIGH (ref 4.0–10.5)
nRBC: 0 % (ref 0.0–0.2)

## 2020-07-07 LAB — TRIGLYCERIDES: Triglycerides: 77 mg/dL (ref <150)

## 2020-07-07 MED ORDER — IPRATROPIUM-ALBUTEROL 0.5-2.5 (3) MG/3ML IN SOLN
3.0000 mL | Freq: Two times a day (BID) | RESPIRATORY_TRACT | Status: DC
  Administered 2020-07-07 – 2020-07-10 (×7): 3 mL via RESPIRATORY_TRACT
  Filled 2020-07-07 (×7): qty 3

## 2020-07-07 MED ORDER — CHLORHEXIDINE GLUCONATE 0.12 % MT SOLN
OROMUCOSAL | Status: AC
  Administered 2020-07-07: 15 mL via OROMUCOSAL
  Filled 2020-07-07: qty 15

## 2020-07-07 NOTE — Progress Notes (Signed)
CRITICAL CARE NOTE  Patient background: 75 year old male post COVID-19 infection 06/05/2020 presenting to the ED with 2 days of cough and shortness of breath at home after discharge from the [place]. patient developed acute hypercapnic respiratory failure, becoming obtunded requiring rapid sequence intubation and mechanical ventilation admitted to ICU.  Recent COVID-19 infection on 06/05/2020 with hospitalization at the [place].  Upon discharge from the [place] patient was prescribed 3 L nasal cannula of oxygen to be worn around the clock at home. Per wife's report patient had a repeat Covid test that was -2 weeks ago.   Patient reported to the ED physician upon arrival that on the morning of 06/28/2020 he checked his SPO2 at home and it was 82% on 3 L and came to the ED to be evaluated. He denied chest pain/recent fever.  ED course: D-dimer elevated >>CTA negative for PE &troponins flat at 10 x2. Due to concern for COPD exacerbation, patient received 2 duo nebs and a dose of Solu-Medrol.  Additional work-up performed for acute encephalopathy with intermittent "jerks" causing concern for possible seizures activity including bilateral lower extremity ultrasound negative for DVT, consultation with neurology and Keppra administration. ABG revealed PCO2 at 148, PCCM stat consulted for possible need of emergent intubation and mechanical ventilation due to obtunded LOC.    SUBJECTIVE    06/09/2020-admitted by [facility], became obtunded while rooming in the ED, PCO2 148, requiring RSI and mechanical ventilation. Admitted to the ICU 1/31 severe resp failure, failed weaning trials 2/1 severe resp failure 2/2 failed multiple weaning trials, severe resp failure 2/04- patient is critically ill, I met with family today and they had asked about prognosis which I explained is poor. I have allowed 1 person at a time to come in.  Family is considering comfort care vs possibly tracheostomy.  07/04/2020- Patient on 40% FiO2,  spoke with wife, shes is discussing goals of care with children and will speak with Palliative and PCCM team after.  07/05/2020- patient had awakening trial today, he was unable to follow any commands.  Met with family today. 07/06/2020-ventilator asynchrony when sedation is lightened, readdress goals of care with patient's wife and daughter 07/07/2020-persistent unresponsiveness and ventilator asynchrony when sedation is off.  Inability to wean.   Past Medical History:  COPD -3 L nasal cannula chronic O2 Hypertension Asbestos exposure Hyperlipidemia Obesity Former smoker   Procedures:  06/28/2020 ETT >> dislodged 07/02/2020 07/02/2020>> reintubated  Significant Diagnostic Tests:  06/15/2020 CTa chest>>no acute findings, negative for PE, no evidence of pneumonia or pulmonary edema. Pleural plaques bilaterally compatible with sequela of asbestos exposure. Asymmetrically prominent pleural thickening in the right upper lobe.  06/20/2020 [place] BLE>>negative for DVT 06/07/2020 CT head>>sinusitis primarily on the right maxillary antrum, no other abnormality Micro Data:  06/13/2020 COVID-19 >>NEGATIVE  Antimicrobials:  06/14/2020 azithromycin >> stopped   Medications Scheduled Meds: . vitamin C  500 mg Per Tube Daily  . budesonide (PULMICORT) nebulizer solution  0.5 mg Nebulization BID  . chlorhexidine gluconate (MEDLINE KIT)  15 mL Mouth Rinse BID  . Chlorhexidine Gluconate Cloth  6 each Topical Daily  . docusate  100 mg Per Tube BID  . enoxaparin (LOVENOX) injection  0.5 mg/kg Subcutaneous Q24H  . famotidine  20 mg Per Tube Daily  . feeding supplement (PROSource TF)  45 mL Per Tube 5 X Daily  . free water  200 mL Per Tube Q4H  . furosemide  40 mg Intravenous Daily  . ipratropium-albuterol  3 mL Nebulization BID  .  lactulose  10 g Per Tube BID  . mouth rinse  15 mL Mouth Rinse 10 times per day  . methylPREDNISolone (SOLU-MEDROL) injection  40 mg Intravenous Q12H  .  polyethylene glycol  17 g Per Tube Daily  . QUEtiapine  50 mg Per Tube BID  . sodium chloride flush  10-40 mL Intracatheter Q12H  . zinc sulfate  220 mg Per Tube Daily   Continuous Infusions: . sodium chloride    . feeding supplement (VITAL 1.5 CAL) 1,000 mL (07/06/20 1452)  . fentaNYL infusion INTRAVENOUS 50 mcg/hr (07/07/20 1900)  . propofol (DIPRIVAN) infusion Stopped (07/04/20 1704)   PRN Meds:.acetaminophen, albuterol, fentaNYL, hydrALAZINE, sodium chloride flush   CC  follow up respiratory failure  SUBJECTIVE Patient remains critically ill Prognosis is guarded  Vent Mode: PRVC FiO2 (%):  [24 %-40 %] 40 % Set Rate:  [14 bmp] 14 bmp Vt Set:  [500 mL] 500 mL PEEP:  [5 cmH20] 5 cmH20 Plateau Pressure:  [18 cmH20-19 cmH20] 18 cmH20 CBC    Component Value Date/Time   WBC 14.4 (H) 07/07/2020 0554   RBC 4.67 07/07/2020 0554   HGB 13.3 07/07/2020 0554   HCT 43.3 07/07/2020 0554   PLT 248 07/07/2020 0554   MCV 92.7 07/07/2020 0554   MCH 28.5 07/07/2020 0554   MCHC 30.7 07/07/2020 0554   RDW 15.0 07/07/2020 0554   LYMPHSABS 0.7 07/07/2020 0554   MONOABS 1.5 (H) 07/07/2020 0554   EOSABS 0.0 07/07/2020 0554   BASOSABS 0.0 07/07/2020 0554   BMP Latest Ref Rng & Units 07/07/2020 07/06/2020 07/05/2020  Glucose 70 - 99 mg/dL 150(H) 146(H) 141(H)  BUN 8 - 23 mg/dL 78(H) 69(H) 72(H)  Creatinine 0.61 - 1.24 mg/dL 1.80(H) 1.44(H) 1.32(H)  Sodium 135 - 145 mmol/L 150(H) 149(H) 147(H)  Potassium 3.5 - 5.1 mmol/L 3.7 3.4(L) 3.7  Chloride 98 - 111 mmol/L 98 94(L) 93(L)  CO2 22 - 32 mmol/L 37(H) 39(H) 40(H)  Calcium 8.9 - 10.3 mg/dL 9.1 9.2 9.4    BP 130/71   Pulse (!) 112   Temp 99.68 F (37.6 C)   Resp (!) 25   Ht $R'5\' 10"'AF$  (1.778 m)   Wt 114.1 kg   SpO2 95%   BMI 36.09 kg/m    I/O last 3 completed shifts: In: 1503.2 [I.V.:133.2; NG/GT:1370] Out: 4300 [Urine:4300] No intake/output data recorded.  SpO2: 95 % O2 Flow Rate (L/min): 6 L/min FiO2 (%): 40 %  Estimated body  mass index is 36.09 kg/m as calculated from the following:   Height as of this encounter: $RemoveBeforeD'5\' 10"'[id number]$  (1.778 m).   Weight as of this encounter: 114.1 kg.   REVIEW OF SYSTEMS Patient is unable to provide review of systems due to intubated status     GENERAL: Obese gentleman, unresponsive on vent.  Minimal sedation. HEAD: Normocephalic, atraumatic.  EYES: Pupils equal, round, reactive to light.  No scleral icterus.  MOUTH: Orotracheally intubated, OG in place. NECK: Supple. No thyromegaly. Trachea midline. No JVD.  No adenopathy. PULMONARY: Good air entry bilaterally.  Rhonchi throughout.   CARDIOVASCULAR: S1 and S2. Regular rate and rhythm.  No rubs, murmurs or gallops heard. ABDOMEN: Protuberant, soft, nondistended, normoactive bowel sounds, tolerating tube feeds MUSCULOSKELETAL: No joint deformity, no clubbing, no edema.  NEUROLOGIC: When sedation lightened, asynchronous with vent, not following commands. SKIN: Intact,warm,dry.   CULTURE RESULTS   Recent Results (from the past 240 hour(s))  SARS Coronavirus 2 by RT PCR (hospital order, performed in [facility] hospital  lab) Nasopharyngeal Nasopharyngeal Swab     Status: None   Collection Time: 06/01/2020  9:36 PM   Specimen: Nasopharyngeal Swab  Result Value Ref Range Status   SARS Coronavirus 2 NEGATIVE NEGATIVE Final    Comment: (NOTE) SARS-CoV-2 target nucleic acids are NOT DETECTED.  The SARS-CoV-2 RNA is generally detectable in upper and lower respiratory specimens during the acute phase of infection. The lowest concentration of SARS-CoV-2 viral copies this assay can detect is 250 copies / mL. A negative result does not preclude SARS-CoV-2 infection and should not be used as the sole basis for treatment or other patient management decisions.  A negative result may occur with improper specimen collection / handling, submission of specimen other than nasopharyngeal swab, presence of viral mutation(s) within the areas targeted  by this assay, and inadequate number of viral copies (<250 copies / mL). A negative result must be combined with clinical observations, patient history, and epidemiological information.  Fact Sheet for Patients:   StrictlyIdeas.no  Fact Sheet for Healthcare Providers: BankingDealers.co.za  This test is not yet approved or  cleared by the [place] FDA and has been authorized for detection and/or diagnosis of SARS-CoV-2 by FDA under an Emergency Use Authorization (EUA).  This EUA will remain in effect (meaning this test can be used) for the duration of the COVID-19 declaration under Section 564(b)(1) of the Act, 21 U.S.C. section 360bbb-3(b)(1), unless the authorization is terminated or revoked sooner.  Performed at [facility], [address] [place], [place] [address]   MRSA PCR Screening     Status: None   Collection Time: 06/01/2020 11:39 PM   Specimen: Nasopharyngeal  Result Value Ref Range Status   MRSA by PCR NEGATIVE NEGATIVE Final    Comment:        The GeneXpert MRSA Assay (FDA approved for NASAL specimens only), is one component of a comprehensive MRSA colonization surveillance program. It is not intended to diagnose MRSA infection nor to guide or monitor treatment for MRSA infections. Performed at [facility], [address] [place], [place] [address]   Culture, respiratory (non-expectorated)     Status: None   Collection Time: 07/01/20 10:50 AM   Specimen: Tracheal Aspirate; Respiratory  Result Value Ref Range Status   Specimen Description   Final    TRACHEAL ASPIRATE Performed at [facility], [address], [place], [place] [address]    Special Requests   Final    NONE Performed at [facility], [address] [place], [place] [address]    Gram Stain   Final    FEW WBC PRESENT, PREDOMINANTLY PMN MODERATE GRAM POSITIVE COCCI MODERATE GRAM POSITIVE RODS     Culture   Final    FEW Normal respiratory flora-no Staph aureus or Pseudomonas seen Performed at [facility] [address], [place], [place] [address]    Report Status 07/04/2020 FINAL  Final  Culture, respiratory (non-expectorated)     Status: None (Preliminary result)   Collection Time: 07/05/20  7:34 PM   Specimen: Tracheal Aspirate; Respiratory  Result Value Ref Range Status   Specimen Description   Final    TRACHEAL ASPIRATE Performed at [facility], [address] [place], [place] [address]    Special Requests   Final    NONE Performed at [facility], [address] [place], [place] [address]    Gram Stain   Final    FEW WBC PRESENT, PREDOMINANTLY PMN MODERATE GRAM  POSITIVE COCCI IN PAIRS IN CHAINS RARE GRAM NEGATIVE RODS    Culture   Final    CULTURE REINCUBATED FOR BETTER GROWTH Performed at [facility], [place] [address] [place], [place] [address]    Report Status PENDING  Incomplete     Results for orders placed or performed during the hospital encounter of 06/07/2020 (from the past 24 hour(s))  Glucose, capillary     Status: Abnormal   Collection Time: 07/06/20 11:08 PM  Result Value Ref Range   Glucose-Capillary 171 (H) 70 - 99 mg/dL  Glucose, capillary     Status: Abnormal   Collection Time: 07/07/20  3:04 AM  Result Value Ref Range   Glucose-Capillary 156 (H) 70 - 99 mg/dL  Triglycerides     Status: None   Collection Time: 07/07/20  5:54 AM  Result Value Ref Range   Triglycerides 77 <150 mg/dL  CBC with Differential/Platelet     Status: Abnormal   Collection Time: 07/07/20  5:54 AM  Result Value Ref Range   WBC 14.4 (H) 4.0 - 10.5 K/uL   RBC 4.67 4.22 - 5.81 MIL/uL   Hemoglobin 13.3 13.0 - 17.0 g/dL   HCT 43.3 39.0 - 52.0 %   MCV 92.7 80.0 - 100.0 fL   MCH 28.5 26.0 - 34.0 pg   MCHC 30.7 30.0 - 36.0 g/dL   RDW 15.0 11.5 - 15.5 %   Platelets 248 150 - 400 K/uL   nRBC 0.0 0.0 - 0.2 %   Neutrophils Relative % 85 %    Neutro Abs 12.1 (H) 1.7 - 7.7 K/uL   Lymphocytes Relative 5 %   Lymphs Abs 0.7 0.7 - 4.0 K/uL   Monocytes Relative 10 %   Monocytes Absolute 1.5 (H) 0.1 - 1.0 K/uL   Eosinophils Relative 0 %   Eosinophils Absolute 0.0 0.0 - 0.5 K/uL   Basophils Relative 0 %   Basophils Absolute 0.0 0.0 - 0.1 K/uL   Immature Granulocytes 0 %   Abs Immature Granulocytes 0.06 0.00 - 0.07 K/uL  Basic metabolic panel     Status: Abnormal   Collection Time: 07/07/20  5:54 AM  Result Value Ref Range   Sodium 150 (H) 135 - 145 mmol/L   Potassium 3.7 3.5 - 5.1 mmol/L   Chloride 98 98 - 111 mmol/L   CO2 37 (H) 22 - 32 mmol/L   Glucose, Bld 150 (H) 70 - 99 mg/dL   BUN 78 (H) 8 - 23 mg/dL   Creatinine, Ser 1.80 (H) 0.61 - 1.24 mg/dL   Calcium 9.1 8.9 - 10.3 mg/dL   GFR, Estimated 39 (L) >60 mL/min   Anion gap 15 5 - 15  Glucose, capillary     Status: Abnormal   Collection Time: 07/07/20  7:32 AM  Result Value Ref Range   Glucose-Capillary 154 (H) 70 - 99 mg/dL  Glucose, capillary     Status: Abnormal   Collection Time: 07/07/20  4:33 PM  Result Value Ref Range   Glucose-Capillary 185 (H) 70 - 99 mg/dL  Glucose, capillary     Status: Abnormal   Collection Time: 07/07/20  7:26 PM  Result Value Ref Range   Glucose-Capillary 173 (H) 70 - 99 mg/dL        IMAGING    DG Chest Port 1 View  Result Date: 07/07/2020 CLINICAL DATA:  Acute respiratory failure EXAM: PORTABLE CHEST 1 VIEW COMPARISON:  07/04/2020 FINDINGS: The endotracheal tube terminates above the carina. The  enteric tube extends below the left hemidiaphragm. There are diffuse bibasilar airspace opacities which have worsened since the prior study. There are growing bilateral pleural effusions. The heart size is stable. There is no pneumothorax. There is persistent vascular congestion. IMPRESSION: 1. The endotracheal tube appears to terminate at the level of the carina. Consider retracting the tube by approximately 1 cm. The remaining lines and  tubes are stable. 2. Worsening bibasilar airspace disease favored to represent a combination of atelectasis and growing bilateral pleural effusions. Electronically Signed   By: Constance Holster M.D.   On: 07/07/2020 01:51     Nutrition Status: Nutrition Problem: Inadequate oral intake Etiology: inability to eat Signs/Symptoms: NPO status Interventions: Tube feeding,Prostat     Indwelling Urinary Catheter continued, requirement due to   Reason to continue Indwelling Urinary Catheter strict Intake/Output monitoring for hemodynamic instability   Central Line/ continued, requirement due to  Reason to continue [place] of central venous pressure or other hemodynamic parameters and poor IV access   Ventilator continued, requirement due to severe respiratory failure   Ventilator Sedation RASS 0 to -2      ASSESSMENT AND PLAN SYNOPSIS Patient presents with 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  Prior history of COVID-19 infection, morbid obesity   Severe ACUTE on chronic Hypoxic and Hypercapnic Respiratory Failure -continue Full MV support -continue Bronchodilator Therapy -Wean Fio2 and PEEP as tolerated -will perform SAT/SBT when respiratory parameters are met -VAP/VENT bundle implementation       COPD Asbestosis These issues add complexity to his management PFT 2019 - There is a severe reduction of the FEV1 and FVC. The diffusing capacity is moderately reduced. There is no significant response to bronchodilators.  Spirometry more consistent with restriction in this setting bronchodilators will be of little use. History of pulmonary asbestosis seen by Annitta Jersey in previously  and advanced COPD Has been oxygen dependent   Morbid obesity, possible OSA.   Will certainly impact respiratory mechanics, ventilator weaning Suspect will need to consider additional PEEP   ACUTE KIDNEY INJURY/Renal  Failure -continue Foley Catheter-assess need -Avoid nephrotoxic agents -Follow urine output, BMP -Ensure adequate renal perfusion, optimize oxygenation -Renal dose medications  Acute metabolic encephalopathy Suspect HIE Could not image with MRI Poor prognosis   GI GI PROPHYLAXIS as indicated   DIET-->TF's as tolerated Constipation protocol as indicated  ENDO - will use ICU hypoglycemic\Hyperglycemia protocol if indicated    ELECTROLYTES -follow labs as needed -replace as needed -pharmacy consultation and following   DVT/GI PRX ordered and assessed TRANSFUSIONS AS NEEDED MONITOR FSBS I Assessed the need for Labs I Assessed the need for Foley I Assessed the need for Central Venous Line Family Discussion when available I Assessed the need for Mobilization I made an Assessment of medications to be adjusted accordingly Safety Risk assessment completed   CASE DISCUSSED IN MULTIDISCIPLINARY ROUNDS WITH ICU TEAM  Critical Care Time devoted to patient care services described in this note is 40 minutes.   Overall, patient is critically ill, prognosis is guarded.  Patient with multiorgan failure and at high risk for cardiac arrest and death.   Updated patient's family at bedside.  They are still pondering transitioning to comfort care.  Renold Don, MD [facility]   *This note was dictated using voice recognition software/Dragon.  Despite best efforts to proofread, errors can occur which can change the meaning.  Any change was purely unintentional.

## 2020-07-07 NOTE — Consult Note (Signed)
PHARMACY CONSULT NOTE  Pharmacy Consult for Electrolyte Monitoring and Replacement   Recent Labs: Potassium (mmol/L)  Date Value  07/07/2020 3.7   Magnesium (mg/dL)  Date Value  20/81/3887 2.5 (H)   Calcium (mg/dL)  Date Value  19/59/7471 9.1   Albumin (g/dL)  Date Value  85/50/1586 3.5   Phosphorus (mg/dL)  Date Value  82/57/4935 3.4   Sodium (mmol/L)  Date Value  07/07/2020 150 (H)     Assessment: 75 y/o male presented on 1/29 with altered mental status and SOB requiring emergency intubation in ED. Pharmacy has been consulted for electrolyte management.  On Lasix 40 mg IV daily On free water 200 mL q4H.   Goal of Therapy:  Electrolytes WNL  Plan:  --No replacement indicated --Continue to follow along  Pricilla Riffle, PharmD 07/07/2020 12:32 PM

## 2020-07-07 NOTE — Progress Notes (Signed)
Sat with wife today. She is tearful as she and family are leading up to making a trough decision for Friday. This couple has been married for 55 years this week. She does not believe her husband would want to be in a prolonged state as this, but states she is struggling with this.

## 2020-07-07 NOTE — Progress Notes (Signed)
Daily Progress Note   Patient Name: Kristopher Blake       Date: 07/07/2020 DOB: [birth year]  Age: 75 y.o. MRN#: [id number] Attending Physician: Tyler Pita, MD Primary Care Physician: Patient, No Pcp Per Admit Date: 06/26/2020  Reason for Consultation/Follow-up: Establishing goals of care  Subjective: Patient is resting in bed on ventilator.  His wife is at bedside.  Questions answered and different scenarios discussed.  She states that she has decided against the tracheostomy as her husband would not want to go to a facility or to have a feeding tube placed, as he currently requires ventilator support.  She states that their anniversary is this Thursday.  She does not want to transition to comfort prior to that date, but may transition to comfort on that day or the day after.  She would like to continue current care.  Length of Stay: 10  Current Medications: Scheduled Meds:  . vitamin C  500 mg Per Tube Daily  . budesonide (PULMICORT) nebulizer solution  0.5 mg Nebulization BID  . chlorhexidine gluconate (MEDLINE KIT)  15 mL Mouth Rinse BID  . Chlorhexidine Gluconate Cloth  6 each Topical Daily  . docusate  100 mg Per Tube BID  . enoxaparin (LOVENOX) injection  0.5 mg/kg Subcutaneous Q24H  . famotidine  20 mg Per Tube Daily  . feeding supplement (PROSource TF)  45 mL Per Tube 5 X Daily  . free water  200 mL Per Tube Q4H  . furosemide  40 mg Intravenous Daily  . ipratropium-albuterol  3 mL Nebulization BID  . lactulose  10 g Per Tube BID  . mouth rinse  15 mL Mouth Rinse 10 times per day  . methylPREDNISolone (SOLU-MEDROL) injection  40 mg Intravenous Q12H  . polyethylene glycol  17 g Per Tube Daily  . QUEtiapine  50 mg Per Tube BID  . sodium chloride flush  10-40 mL Intracatheter  Q12H  . zinc sulfate  220 mg Per Tube Daily    Continuous Infusions: . sodium chloride    . feeding supplement (VITAL 1.5 CAL) 1,000 mL (07/06/20 1452)  . fentaNYL infusion INTRAVENOUS 50 mcg/hr (07/07/20 1000)  . propofol (DIPRIVAN) infusion Stopped (07/04/20 1704)    PRN Meds: acetaminophen, albuterol, fentaNYL, hydrALAZINE, sodium chloride flush  Physical Exam Constitutional:  Comments: On ventilator             Vital Signs: BP 135/65   Pulse (!) 120   Temp (!) 100.94 F (38.3 C) (Esophageal)   Resp (!) 27   Ht '5\' 10"'  (1.778 m)   Wt 114.1 kg   SpO2 93%   BMI 36.09 kg/m  SpO2: SpO2: 93 % O2 Device: O2 Device: Ventilator O2 Flow Rate: O2 Flow Rate (L/min): 6 L/min  Intake/output summary:   Intake/Output Summary (Last 24 hours) at 07/07/2020 1207 Last data filed at 07/07/2020 1000 Gross per 24 hour  Intake 573.33 ml  Output 2500 ml  Net -1926.67 ml   LBM: Last BM Date: 07/07/20 Baseline Weight: Weight: 122.5 kg Most recent weight: Weight: 114.1 kg       Flowsheet Rows   Flowsheet Row Most Recent Value  Intake Tab   Referral Department Critical care  Unit at Time of Referral ICU  Palliative Care Primary Diagnosis Pulmonary  Date Notified 06/29/20  Palliative Care Type New Palliative care  Reason for referral Clarify Goals of Care  Date of Admission 06/08/2020  Date first seen by Palliative Care 06/29/20  # of days Palliative referral response time 0 Day(s)  # of days IP prior to Palliative referral 2  Clinical Assessment   Psychosocial & Spiritual Assessment   Palliative Care Outcomes       Patient Active Problem List   Diagnosis Date Noted  . COPD exacerbation ([place]) 06/22/2020  . Acute on chronic respiratory failure with hypoxia ([place]) 06/10/2020  . COVID-19 virus infection 06/21/2020  . Hypokalemia 06/19/2020  . Acute metabolic encephalopathy 41/93/7902  . Acute respiratory failure ([place]) 06/26/2020  . Pain due to onychomycosis of toenails of  both feet 12/03/2018  . COPD (chronic obstructive pulmonary disease) ([place]) 11/28/2017  . H/O asbestos exposure 11/28/2017  . HLD (hyperlipidemia) 11/28/2017  . HTN (hypertension) 11/28/2017  . Obesity, unspecified 11/28/2017  . Former cigarette smoker 06/05/2014    Palliative Care Assessment & Plan    Recommendations/Plan: Continue current care until Thursday which is their 55th anniversary, or the day after-wife is still determining when to shift to comfort. Wife does not want tracheostomy as patient is still requiring ventilator support.  She states he would not want to go to a facility or have a feeding tube placed.    Code Status:    Code Status Orders  (From admission, onward)         Start     Ordered   07/02/20 1538  Do not attempt resuscitation (DNR)  Continuous       Question Answer Comment  In the event of cardiac or respiratory ARREST Do not call a "code blue"   In the event of cardiac or respiratory ARREST Do not perform Intubation, CPR, defibrillation or ACLS   In the event of cardiac or respiratory ARREST Use medication by any route, position, wound care, and other measures to relive pain and suffering. May use oxygen, suction and manual treatment of airway obstruction as needed for comfort.      07/02/20 1537        Code Status History    Date Active Date Inactive Code Status Order ID Comments User Context   06/28/2020 2001 07/02/2020 1537 Full Code [id number]  Rust-Chester, Huel Cote, NP ED   06/11/2020 1500 06/26/2020 2001 Full Code [id number]  Ivor Costa, MD ED   Advance Care Planning Activity      Prognosis: Poor  Thank you for allowing the Palliative Medicine Team to assist in the care of this patient.   Total Time 25 min Prolonged Time Billed  no      Greater than 50%  of this time was spent counseling and coordinating care related to the above assessment and plan.  Asencion Gowda, NP  Please contact Palliative Medicine Team phone at  [phone] for questions and concerns.

## 2020-07-08 DIAGNOSIS — J9621 Acute and chronic respiratory failure with hypoxia: Secondary | ICD-10-CM | POA: Diagnosis not present

## 2020-07-08 DIAGNOSIS — J441 Chronic obstructive pulmonary disease with (acute) exacerbation: Secondary | ICD-10-CM | POA: Diagnosis not present

## 2020-07-08 DIAGNOSIS — G9341 Metabolic encephalopathy: Secondary | ICD-10-CM | POA: Diagnosis not present

## 2020-07-08 DIAGNOSIS — J9601 Acute respiratory failure with hypoxia: Secondary | ICD-10-CM | POA: Diagnosis not present

## 2020-07-08 LAB — BASIC METABOLIC PANEL
Anion gap: 18 — ABNORMAL HIGH (ref 5–15)
BUN: 111 mg/dL — ABNORMAL HIGH (ref 8–23)
CO2: 34 mmol/L — ABNORMAL HIGH (ref 22–32)
Calcium: 8.5 mg/dL — ABNORMAL LOW (ref 8.9–10.3)
Chloride: 98 mmol/L (ref 98–111)
Creatinine, Ser: 2.52 mg/dL — ABNORMAL HIGH (ref 0.61–1.24)
GFR, Estimated: 26 mL/min — ABNORMAL LOW (ref >60)
Glucose, Bld: 226 mg/dL — ABNORMAL HIGH (ref 70–99)
Potassium: 3.5 mmol/L (ref 3.5–5.1)
Sodium: 150 mmol/L — ABNORMAL HIGH (ref 135–145)

## 2020-07-08 LAB — CBC WITH DIFFERENTIAL/PLATELET
Abs Immature Granulocytes: 0.07 10*3/uL (ref 0.00–0.07)
Basophils Absolute: 0 10*3/uL (ref 0.0–0.1)
Basophils Relative: 0 %
Eosinophils Absolute: 0 10*3/uL (ref 0.0–0.5)
Eosinophils Relative: 0 %
HCT: 44.5 % (ref 39.0–52.0)
Hemoglobin: 13.5 g/dL (ref 13.0–17.0)
Immature Granulocytes: 0 %
Lymphocytes Relative: 3 %
Lymphs Abs: 0.5 10*3/uL — ABNORMAL LOW (ref 0.7–4.0)
MCH: 28.2 pg (ref 26.0–34.0)
MCHC: 30.3 g/dL (ref 30.0–36.0)
MCV: 92.9 fL (ref 80.0–100.0)
Monocytes Absolute: 1.1 10*3/uL — ABNORMAL HIGH (ref 0.1–1.0)
Monocytes Relative: 7 %
Neutro Abs: 13.9 10*3/uL — ABNORMAL HIGH (ref 1.7–7.7)
Neutrophils Relative %: 90 %
Platelets: 215 10*3/uL (ref 150–400)
RBC: 4.79 MIL/uL (ref 4.22–5.81)
RDW: 15 % (ref 11.5–15.5)
WBC: 15.6 10*3/uL — ABNORMAL HIGH (ref 4.0–10.5)
nRBC: 0 % (ref 0.0–0.2)

## 2020-07-08 LAB — GLUCOSE, CAPILLARY
Glucose-Capillary: 213 mg/dL — ABNORMAL HIGH (ref 70–99)
Glucose-Capillary: 185 mg/dL — ABNORMAL HIGH (ref 70–99)
Glucose-Capillary: 189 mg/dL — ABNORMAL HIGH (ref 70–99)
Glucose-Capillary: 209 mg/dL — ABNORMAL HIGH (ref 70–99)
Glucose-Capillary: 188 mg/dL — ABNORMAL HIGH (ref 70–99)

## 2020-07-08 LAB — CULTURE, RESPIRATORY W GRAM STAIN: Culture: NORMAL

## 2020-07-08 MED ORDER — ACETAMINOPHEN 325 MG PO TABS
650.0000 mg | ORAL_TABLET | Freq: Four times a day (QID) | ORAL | Status: DC | PRN
  Administered 2020-07-08 – 2020-07-10 (×3): 650 mg
  Filled 2020-07-08 (×3): qty 2

## 2020-07-08 MED ORDER — INSULIN ASPART 100 UNIT/ML ~~LOC~~ SOLN
0.0000 [IU] | SUBCUTANEOUS | Status: DC
  Administered 2020-07-08: 2 [IU] via SUBCUTANEOUS
  Administered 2020-07-08: 3 [IU] via SUBCUTANEOUS
  Administered 2020-07-08: 2 [IU] via SUBCUTANEOUS
  Administered 2020-07-09: 3 [IU] via SUBCUTANEOUS
  Administered 2020-07-09: 2 [IU] via SUBCUTANEOUS
  Administered 2020-07-09 (×2): 3 [IU] via SUBCUTANEOUS
  Administered 2020-07-09: 2 [IU] via SUBCUTANEOUS
  Administered 2020-07-09 – 2020-07-10 (×2): 3 [IU] via SUBCUTANEOUS
  Administered 2020-07-10: 5 [IU] via SUBCUTANEOUS
  Filled 2020-07-08 (×11): qty 1

## 2020-07-08 NOTE — Progress Notes (Signed)
CRITICAL CARE NOTE 75 year old male post COVID-66 infection 06/05/2020 presenting to the ED with 2 days of cough and shortness of breath at home after discharge from the [place]. patient developed acute hypercapnic respiratory failure, becoming obtunded requiring rapid sequence intubation and mechanical ventilation admitted to ICU.  Recent COVID-19 infection on 06/05/2020 with hospitalization at the [place].  Upon discharge from the [place] patient was prescribed 3 L nasal cannula of oxygen to be worn around the clock at home. Per wife's report patient had a repeat Covid test that was -2 weeks ago.   Patient reported to the ED physician upon arrival that on the morning of 06/10/2020 he checked his SPO2 at home and it was 82% on 3 L and came to the ED to be evaluated. He denied chest pain/recent fever.  ED course: D-dimer elevated >>CTA negative for PE &troponins flat at 10 x2. Due to concern for COPD exacerbation, patient received 2 duo nebs and a dose of Solu-Medrol.  Additional work-up performed for acute encephalopathy with intermittent "jerks" causing concern for possible seizures activity including bilateral lower extremity ultrasound negative for DVT, consultation with neurology and Keppra administration. ABG revealed PCO2 at 148, PCCM stat consulted for possible need of emergent intubation and mechanical ventilation due to obtunded LOC.    SUBJECTIVE    06/16/2020-admitted by [facility], became obtunded while rooming in the ED, PCO2 148, requiring RSI and mechanical ventilation. Admitted to the ICU 1/31 severe resp failure, failed weaning trials 2/1 severe resp failure 2/2 failed multiple weaning trials, severe resp failure 2/04- patient is critically ill, I met with family today and they had asked about prognosis which I explained is poor. I have allowed 1 person at a time to come in.  Family is considering comfort care vs possibly tracheostomy.  07/04/2020- Patient on 40% FiO2, spoke with wife, shes  is discussing goals of care with children and will speak with Palliative and PCCM team after.  07/05/2020- patient had awakening trial today, he was unable to follow any commands.  Met with family today. 07/06/2020-ventilator asynchrony when sedation is lightened, readdress goals of care with patient's wife and daughter 2/8 remains on vent   Past Medical History:  COPD -3 L nasal cannula chronic O2 Hypertension Asbestos exposure Hyperlipidemia Obesity Former smoker   Procedures:  06/01/2020 ETT >> dislodged 07/02/2020 07/02/2020>> reintubated  Significant Diagnostic Tests:  06/10/2020 CTa chest>>no acute findings, negative for PE, no evidence of pneumonia or pulmonary edema. Pleural plaques bilaterally compatible with sequela of asbestos exposure. Asymmetrically prominent pleural thickening in the right upper lobe.  06/11/2020 [place] BLE>>negative for DVT 06/17/2020 CT head>>sinusitis primarily on the right maxillary antrum, no other abnormality Micro Data:  06/25/2020 COVID-19 >>NEGATIVE        CC  follow up respiratory failure  SUBJECTIVE Patient remains critically ill Prognosis is guarded  CBC    Component Value Date/Time   WBC 15.6 (H) 07/08/2020 0603   RBC 4.79 07/08/2020 0603   HGB 13.5 07/08/2020 0603   HCT 44.5 07/08/2020 0603   PLT 215 07/08/2020 0603   MCV 92.9 07/08/2020 0603   MCH 28.2 07/08/2020 0603   MCHC 30.3 07/08/2020 0603   RDW 15.0 07/08/2020 0603   LYMPHSABS 0.5 (L) 07/08/2020 0603   MONOABS 1.1 (H) 07/08/2020 0603   EOSABS 0.0 07/08/2020 0603   BASOSABS 0.0 07/08/2020 0603   BMP Latest Ref Rng & Units 07/08/2020 07/07/2020 07/06/2020  Glucose 70 - 99 mg/dL 226(H) 150(H) 146(H)  BUN 8 - 23  mg/dL 111(H) 78(H) 69(H)  Creatinine 0.61 - 1.24 mg/dL 2.52(H) 1.80(H) 1.44(H)  Sodium 135 - 145 mmol/L 150(H) 150(H) 149(H)  Potassium 3.5 - 5.1 mmol/L 3.5 3.7 3.4(L)  Chloride 98 - 111 mmol/L 98 98 94(L)  CO2 22 - 32 mmol/L 34(H) 37(H) 39(H)   Calcium 8.9 - 10.3 mg/dL 8.5(L) 9.1 9.2    BP 111/72   Pulse (!) 109   Temp (!) 100.4 F (38 C)   Resp (!) 24   Ht _0  (1.778 m)   Wt 114.1 kg   SpO2 95%   BMI 36.09 kg/m    I/O last 3 completed shifts: In: 1503.2 [I.V.:133.2; NG/GT:1370] Out: 3600 [Urine:3600] No intake/output data recorded.  SpO2: 95 % O2 Flow Rate (L/min): 6 L/min FiO2 (%): 40 %  Estimated body mass index is 36.09 kg/m as calculated from the following:   Height as of this encounter: _1  (1.778 m).   Weight as of this encounter: 114.1 kg.  SIGNIFICANT EVENTS   REVIEW OF SYSTEMS  PATIENT IS UNABLE TO PROVIDE COMPLETE REVIEW OF SYSTEMS DUE TO SEVERE CRITICAL ILLNESS        PHYSICAL EXAMINATION:  GENERAL:critically ill appearing, +resp distress EYES: Pupils equal, round, reactive to light.  No scleral icterus.  MOUTH: Moist mucosal membrane. NECK: Supple.  PULMONARY: +rhonchi, +wheezing CARDIOVASCULAR: S1 and S2. Regular rate and rhythm. No murmurs, rubs, or gallops.  GASTROINTESTINAL: Soft, nontender, -distended.  Positive bowel sounds.   MUSCULOSKELETAL: No swelling, clubbing, or edema.  NEUROLOGIC: obtunded, GCS<8 SKIN:intact,warm,dry  MEDICATIONS: I have reviewed all medications and confirmed regimen as documented   CULTURE RESULTS   Recent Results (from the past 240 hour(s))  Culture, respiratory (non-expectorated)     Status: None   Collection Time: 07/01/20 10:50 AM   Specimen: Tracheal Aspirate; Respiratory  Result Value Ref Range Status   Specimen Description   Final    TRACHEAL ASPIRATE Performed at [facility], [address] [place], [place] [address]    Special Requests   Final    NONE Performed at [facility], [address] [place], [place] [address]    Gram Stain   Final    FEW WBC PRESENT, PREDOMINANTLY PMN MODERATE GRAM POSITIVE COCCI MODERATE GRAM POSITIVE RODS    Culture   Final    FEW Normal respiratory flora-no Staph  aureus or Pseudomonas seen Performed at [facility] [address] [place], [place] [address]    Report Status 07/04/2020 FINAL  Final  Culture, respiratory (non-expectorated)     Status: None (Preliminary result)   Collection Time: 07/05/20  7:34 PM   Specimen: Tracheal Aspirate; Respiratory  Result Value Ref Range Status   Specimen Description   Final    TRACHEAL ASPIRATE Performed at [facility], [address] [place], [place] [address]    Special Requests   Final    NONE Performed at [facility], [address] [place], [place] [address]    Gram Stain   Final    FEW WBC PRESENT, PREDOMINANTLY PMN MODERATE GRAM POSITIVE COCCI IN PAIRS IN CHAINS RARE GRAM NEGATIVE RODS    Culture   Final    CULTURE REINCUBATED FOR BETTER GROWTH Performed at [facility], [place] [address], [place],  [address]    Report Status PENDING  Incomplete          IMAGING    No results found.   Nutrition Status: Nutrition Problem: Inadequate oral  intake Etiology: inability to eat Signs/Symptoms: NPO status Interventions: Tube feeding,Prostat     Indwelling Urinary Catheter continued, requirement due to   Reason to continue Indwelling Urinary Catheter strict Intake/Output monitoring for hemodynamic instability   Central Line/ continued, requirement due to  Reason to continue [place] of central venous pressure or other hemodynamic parameters and poor IV access   Ventilator continued, requirement due to severe respiratory failure   Ventilator Sedation RASS 0 to -2      ASSESSMENT AND PLAN SYNOPSIS  75 yo with Severe ACUTE Hypoxic and Hypercapnic Respiratory Failuredue to severe COPD exacerbation with previous COVID 19 infection and morbid obesity failure to wean from vent with severe metabolic encephalopathy   Severe ACUTE Hypoxic and Hypercapnic Respiratory Failure -continue Full MV support -continue Bronchodilator  Therapy -Wean Fio2 and PEEP as tolerated -VAP/VENT bundle implementation Needs trach and PEG Tube to survive  ACUTE DAISTOLIC CARDIAC FAILURE- EF -oxygen as needed -Lasix as tolerated   Morbid obesity, possible OSA.   Will certainly impact respiratory mechanics, ventilator weaning Suspect will need to consider additional PEEP   ACUTE KIDNEY INJURY/Renal Failure -continue Foley Catheter-assess need -Avoid nephrotoxic agents -Follow urine output, BMP -Ensure adequate renal perfusion, optimize oxygenation -Renal dose medications     NEUROLOGY Acute toxic metabolic encephalopathy, need for sedation Goal RASS -2 to -3   CARDIAC ICU monitoring  ID -continue IV abx as prescibed -follow up cultures  GI GI PROPHYLAXIS as indicated  DIET-->TF's as tolerated Constipation protocol as indicated  ENDO - will use ICU hypoglycemic\Hyperglycemia protocol if indicated     ELECTROLYTES -follow labs as needed -replace as needed -pharmacy consultation and following   DVT/GI PRX ordered and assessed TRANSFUSIONS AS NEEDED MONITOR FSBS I Assessed the need for Labs I Assessed the need for Foley I Assessed the need for Central Venous Line Family Discussion when available I Assessed the need for Mobilization I made an Assessment of medications to be adjusted accordingly Safety Risk assessment completed   CASE DISCUSSED IN MULTIDISCIPLINARY ROUNDS WITH ICU TEAM  Critical Care Time devoted to patient care services described in this note is 46 minutes.   Overall, patient is critically ill, prognosis is guarded.  Patient with Multiorgan failure and at high risk for cardiac arrest and death.    Corrin Parker, M.D.  [facility]  Medical Director [place] Director [facility] Cardio-Pulmonary Department

## 2020-07-08 NOTE — Progress Notes (Signed)
GOALS OF CARE DISCUSSION  The Clinical status was relayed to family in detail. Wife and Daughter at bedside  Updated and notified of patients medical condition.  Patient remains unresponsive and will not open eyes to command.    Patient is having a weak cough and struggling to remove secretions.   Patient with increased WOB and using accessory muscles to breathe Explained to family course of therapy and the modalities     Patient with Progressive multiorgan failure with a very high probablity of a very minimal chance of meaningful recovery despite all aggressive and optimal medical therapy. Process associated with Suffering.  Family understands the situation.  They have consented and agreed to DNR/DNI.  Plan is to proceed with one way extubation at some point in time.   Family are satisfied with Plan of action and management. All questions answered.    Additional CC time 32 mins   Shephanie Romas Santiago Glad, M.D.  [facility]  Medical Director [facility] Medical Director [facility] Cardio-Pulmonary Department

## 2020-07-08 NOTE — Consult Note (Signed)
PHARMACY CONSULT NOTE  Pharmacy Consult for Electrolyte Monitoring and Replacement   Recent Labs: Potassium (mmol/L)  Date Value  07/08/2020 3.5   Magnesium (mg/dL)  Date Value  40/98/1191 2.5 (H)   Calcium (mg/dL)  Date Value  47/82/9562 8.5 (L)   Albumin (g/dL)  Date Value  13/12/6576 3.5   Phosphorus (mg/dL)  Date Value  46/96/2952 3.4   Sodium (mmol/L)  Date Value  07/08/2020 150 (H)     Assessment: 75 y/o male presented on 1/29 with altered mental status and SOB requiring emergency intubation in ED. Pharmacy has been consulted for electrolyte management.  On Lasix 40 mg IV daily On free water 200 mL q4H.   Goal of Therapy:  Electrolytes WNL  Plan:  --No replacement indicated --Continue to follow along  Pricilla Riffle, PharmD 07/08/2020 12:01 PM

## 2020-07-09 LAB — CBC WITH DIFFERENTIAL/PLATELET
Abs Immature Granulocytes: 0.07 10*3/uL (ref 0.00–0.07)
Basophils Absolute: 0 10*3/uL (ref 0.0–0.1)
Basophils Relative: 0 %
Eosinophils Absolute: 0 10*3/uL (ref 0.0–0.5)
Eosinophils Relative: 0 %
HCT: 44.5 % (ref 39.0–52.0)
Hemoglobin: 13.2 g/dL (ref 13.0–17.0)
Immature Granulocytes: 1 %
Lymphocytes Relative: 4 %
Lymphs Abs: 0.5 10*3/uL — ABNORMAL LOW (ref 0.7–4.0)
MCH: 29.1 pg (ref 26.0–34.0)
MCHC: 29.7 g/dL — ABNORMAL LOW (ref 30.0–36.0)
MCV: 98 fL (ref 80.0–100.0)
Monocytes Absolute: 1 10*3/uL (ref 0.1–1.0)
Monocytes Relative: 7 %
Neutro Abs: 13.1 10*3/uL — ABNORMAL HIGH (ref 1.7–7.7)
Neutrophils Relative %: 88 %
Platelets: 196 10*3/uL (ref 150–400)
RBC: 4.54 MIL/uL (ref 4.22–5.81)
RDW: 14.6 % (ref 11.5–15.5)
WBC: 14.7 10*3/uL — ABNORMAL HIGH (ref 4.0–10.5)
nRBC: 0 % (ref 0.0–0.2)

## 2020-07-09 LAB — BASIC METABOLIC PANEL
Anion gap: 20 — ABNORMAL HIGH (ref 5–15)
BUN: 155 mg/dL — ABNORMAL HIGH (ref 8–23)
CO2: 37 mmol/L — ABNORMAL HIGH (ref 22–32)
Calcium: 8.4 mg/dL — ABNORMAL LOW (ref 8.9–10.3)
Chloride: 98 mmol/L (ref 98–111)
Creatinine, Ser: 3.47 mg/dL — ABNORMAL HIGH (ref 0.61–1.24)
GFR, Estimated: 18 mL/min — ABNORMAL LOW (ref >60)
Glucose, Bld: 238 mg/dL — ABNORMAL HIGH (ref 70–99)
Potassium: 4.5 mmol/L (ref 3.5–5.1)
Sodium: 155 mmol/L — ABNORMAL HIGH (ref 135–145)

## 2020-07-09 LAB — MAGNESIUM: Magnesium: 3.4 mg/dL — ABNORMAL HIGH (ref 1.7–2.4)

## 2020-07-09 LAB — GLUCOSE, CAPILLARY
Glucose-Capillary: 193 mg/dL — ABNORMAL HIGH (ref 70–99)
Glucose-Capillary: 196 mg/dL — ABNORMAL HIGH (ref 70–99)
Glucose-Capillary: 207 mg/dL — ABNORMAL HIGH (ref 70–99)
Glucose-Capillary: 210 mg/dL — ABNORMAL HIGH (ref 70–99)
Glucose-Capillary: 216 mg/dL — ABNORMAL HIGH (ref 70–99)
Glucose-Capillary: 219 mg/dL — ABNORMAL HIGH (ref 70–99)

## 2020-07-09 LAB — PHOSPHORUS: Phosphorus: 8.8 mg/dL — ABNORMAL HIGH (ref 2.5–4.6)

## 2020-07-09 MED ORDER — GLYCOPYRROLATE 0.2 MG/ML IJ SOLN
0.3000 mg | INTRAMUSCULAR | Status: DC | PRN
  Administered 2020-07-09: 0.3 mg via INTRAVENOUS
  Filled 2020-07-09: qty 2

## 2020-07-09 MED ORDER — ENOXAPARIN SODIUM 40 MG/0.4ML ~~LOC~~ SOLN
40.0000 mg | SUBCUTANEOUS | Status: DC
  Administered 2020-07-09: 40 mg via SUBCUTANEOUS
  Filled 2020-07-09: qty 0.4

## 2020-07-09 MED ORDER — VANCOMYCIN HCL 2000 MG/400ML IV SOLN
2000.0000 mg | Freq: Once | INTRAVENOUS | Status: AC
  Administered 2020-07-09: 2000 mg via INTRAVENOUS
  Filled 2020-07-09: qty 400

## 2020-07-09 MED ORDER — VANCOMYCIN HCL 500 MG/100ML IV SOLN
500.0000 mg | Freq: Once | INTRAVENOUS | Status: AC
  Administered 2020-07-09: 500 mg via INTRAVENOUS
  Filled 2020-07-09: qty 100

## 2020-07-09 MED ORDER — AMIODARONE HCL IN DEXTROSE 360-4.14 MG/200ML-% IV SOLN
30.0000 mg/h | INTRAVENOUS | Status: DC
  Administered 2020-07-09 (×2): 30 mg/h via INTRAVENOUS
  Filled 2020-07-09: qty 200

## 2020-07-09 MED ORDER — AMIODARONE IV BOLUS ONLY 150 MG/100ML
150.0000 mg | Freq: Once | INTRAVENOUS | Status: AC
  Administered 2020-07-09: 150 mg via INTRAVENOUS

## 2020-07-09 MED ORDER — SODIUM CHLORIDE 0.9 % IV SOLN
2.0000 g | Freq: Two times a day (BID) | INTRAVENOUS | Status: DC
  Administered 2020-07-09 – 2020-07-10 (×2): 2 g via INTRAVENOUS
  Filled 2020-07-09 (×3): qty 2

## 2020-07-09 MED ORDER — AMIODARONE HCL IN DEXTROSE 360-4.14 MG/200ML-% IV SOLN
60.0000 mg/h | INTRAVENOUS | Status: AC
  Administered 2020-07-09 (×2): 60 mg/h via INTRAVENOUS
  Filled 2020-07-09: qty 200

## 2020-07-09 MED ORDER — SCOPOLAMINE 1 MG/3DAYS TD PT72
1.0000 | MEDICATED_PATCH | TRANSDERMAL | Status: DC
  Administered 2020-07-09: 1.5 mg via TRANSDERMAL
  Filled 2020-07-09: qty 1

## 2020-07-09 NOTE — Progress Notes (Signed)
PHARMACIST - PHYSICIAN COMMUNICATION  CONCERNING:  Enoxaparin (Lovenox) for DVT Prophylaxis    RECOMMENDATION: Patient was prescribed enoxaparin 40 mg q24 hours for VTE prophylaxis.   Filed Weights   07/01/20 0500 07/02/20 0500 07/05/20 0500  Weight: 122.5 kg (270 lb 1 oz) 117.8 kg (259 lb 11.2 oz) 114.1 kg (251 lb 8.7 oz)    Body mass index is 36.09 kg/m.  Estimated Creatinine Clearance: 22.9 mL/min (A) (by C-G formula based on SCr of 3.47 mg/dL (H)).   Based on [facility] policy patient is candidate for enoxaparin 40 mg SQ every 24 hours based on CrCl < 30 ml/min and BMI >30.   DESCRIPTION: Pharmacy has adjusted enoxaparin dose per [facility] policy.  Patient is now receiving enoxaparin 40 mg every 24 hours    Pricilla Riffle, PharmD Clinical Pharmacist  07/09/2020 3:43 PM

## 2020-07-09 NOTE — Progress Notes (Signed)
Neuro: stable on sedation, grimaces with oral care, some flexion with oral care Resp: stable on vent settings CV: TMAX 100.5, some generalized edema, strong pulses, tachycardic throughout the night GIGU: OG in place and tolerating feeds well, passing gas, some stool via flexiseal, foley in place stable output Skin: clean and intact, Right ear scabbing-cleansed and open to air Social: family visited for a short time this evening, no further contact  Events: No significant changes.

## 2020-07-09 NOTE — Progress Notes (Signed)
GOALS OF CARE DISCUSSION  The Clinical status was relayed to family in detail. Wife and Daughter at bedside  Updated and notified of patients medical condition.  Patient remains unresponsive and will not open eyes to command.    Patient with increased WOB and using accessory muscles to breathe Explained to family course of therapy and the modalities     Patient with Progressive multiorgan failure with a very high probablity of a very minimal chance of meaningful recovery despite all aggressive and optimal medical therapy. Patient is in the Dying  Process associated with Suffering.  Family understands the situation.  They have consented and agreed to DNR.   Family are satisfied with Plan of action and management. All questions answered  Additional CC time 32 mins   Kristopher Blake, M.D.  [facility]  Medical Director [facility] Medical Director [facility] Cardio-Pulmonary Department

## 2020-07-09 NOTE — Progress Notes (Signed)
CRITICAL CARE NOTE 75 year old male post COVID-28 infection 06/05/2020 presenting to the ED with 2 days of cough and shortness of breath at home after discharge from the [place]. patient developed acute hypercapnic respiratory failure, becoming obtunded requiring rapid sequence intubation and mechanical ventilation admitted to ICU.  Recent COVID-19 infection on 06/05/2020 with hospitalization at the [place].  Upon discharge from the [place] patient was prescribed 3 L nasal cannula of oxygen to be worn around the clock at home. Per wife's report patient had a repeat Covid test that was -2 weeks ago.   Patient reported to the ED physician upon arrival that on the morning of 06/17/2020 he checked his SPO2 at home and it was 82% on 3 L and came to the ED to be evaluated. He denied chest pain/recent fever.  ED course: D-dimer elevated >>CTA negative for PE &troponins flat at 10 x2. Due to concern for COPD exacerbation, patient received 2 duo nebs and a dose of Solu-Medrol.  Additional work-up performed for acute encephalopathy with intermittent "jerks" causing concern for possible seizures activity including bilateral lower extremity ultrasound negative for DVT, consultation with neurology and Keppra administration. ABG revealed PCO2 at 148, PCCM stat consulted for possible need of emergent intubation and mechanical ventilation due to obtunded LOC.   SUBJECTIVE    06/15/2020-admitted by [facility], became obtunded while rooming in the ED, PCO2 148, requiring RSI and mechanical ventilation. Admitted to the ICU 1/31 severe resp failure, failed weaning trials 2/1 severe resp failure 2/2 failed multiple weaning trials, severe resp failure 2/04- patient is critically ill, I met with family today and they had asked about prognosis which I explained is poor. I have allowed 1 person at a time to come in. Family is considering comfort care vs possibly tracheostomy.  07/04/2020- Patient on 40% FiO2, spoke with wife, shes  is discussing goals of care with children and will speak with Palliative and PCCM team after.  07/05/2020-patient had awakening trial today, he was unable to follow any commands. Met with family today. 07/06/2020-ventilator asynchrony when sedation is lightened, readdress goals of care with patient's wife and daughter 2/8 remains on vent 2/9 [place]       CC  follow up respiratory failure  SUBJECTIVE Patient remains critically ill Prognosis is guarded   BP (!) 99/56   Pulse (!) 112   Temp (!) 100.4 F (38 C) (Esophageal)   Resp 14   Ht '5\' 10"'  (1.778 m)   Wt 114.1 kg   SpO2 93%   BMI 36.09 kg/m    I/O last 3 completed shifts: In: 3215.9 [I.V.:585.9; NG/GT:2630] Out: 2910 [Urine:2835; Stool:75] No intake/output data recorded.  SpO2: 93 % O2 Flow Rate (L/min): 6 L/min FiO2 (%): 40 %  Estimated body mass index is 36.09 kg/m as calculated from the following:   Height as of this encounter: '5\' 10"'  (1.778 m).   Weight as of this encounter: 114.1 kg.  SIGNIFICANT EVENTS   REVIEW OF SYSTEMS  PATIENT IS UNABLE TO PROVIDE COMPLETE REVIEW OF SYSTEMS DUE TO SEVERE CRITICAL ILLNESS        PHYSICAL EXAMINATION:  GENERAL:critically ill appearing, +resp distress EYES: Pupils equal, round, reactive to light.  No scleral icterus.  MOUTH: Moist mucosal membrane. NECK: Supple.  PULMONARY: +rhonchi, +wheezing CARDIOVASCULAR: S1 and S2. Regular rate and rhythm. No murmurs, rubs, or gallops.  GASTROINTESTINAL: Soft, nontender, -distended.  Positive bowel sounds.   MUSCULOSKELETAL: No swelling, clubbing, or edema.  NEUROLOGIC: obtunded, GCS<8 SKIN:intact,warm,dry  MEDICATIONS:  I have reviewed all medications and confirmed regimen as documented   CULTURE RESULTS   Recent Results (from the past 240 hour(s))  Culture, respiratory (non-expectorated)     Status: None   Collection Time: 07/01/20 10:50 AM   Specimen: Tracheal Aspirate; Respiratory  Result  Value Ref Range Status   Specimen Description   Final    TRACHEAL ASPIRATE Performed at [facility], [address], [place], [place] [address]    Special Requests   Final    NONE Performed at [facility], [address] [place], [place] [address]    Gram Stain   Final    FEW WBC PRESENT, PREDOMINANTLY PMN MODERATE GRAM POSITIVE COCCI MODERATE GRAM POSITIVE RODS    Culture   Final    FEW Normal respiratory flora-no Staph aureus or Pseudomonas seen Performed at [facility] [address], [place], [place] [address]    Report Status 07/04/2020 FINAL  Final  Culture, respiratory (non-expectorated)     Status: None   Collection Time: 07/05/20  7:34 PM   Specimen: Tracheal Aspirate; Respiratory  Result Value Ref Range Status   Specimen Description   Final    TRACHEAL ASPIRATE Performed at [facility], [address] [place], [place] [address]    Special Requests   Final    NONE Performed at [facility], [address] [place], [place] [address]    Gram Stain   Final    FEW WBC PRESENT, PREDOMINANTLY PMN MODERATE GRAM POSITIVE COCCI IN PAIRS IN CHAINS RARE GRAM NEGATIVE RODS    Culture   Final    FEW Normal respiratory flora-no Staph aureus or Pseudomonas seen Performed at [facility], [address], [place],  [address]    Report Status 07/08/2020 FINAL  Final          IMAGING    No results found.   Nutrition Status: Nutrition Problem: Inadequate oral intake Etiology: inability to eat Signs/Symptoms: NPO status Interventions: Tube feeding,Prostat     Indwelling Urinary Catheter continued, requirement due to   Reason to continue Indwelling Urinary Catheter strict Intake/Output monitoring for hemodynamic instability   Central Line/ continued, requirement due to  Reason to continue [place] of central venous pressure or other hemodynamic parameters and poor IV access    Ventilator continued, requirement due to severe respiratory failure   Ventilator Sedation RASS 0 to -2      ASSESSMENT AND PLAN SYNOPSIS  75 yo with Severe ACUTE Hypoxic and Hypercapnic Respiratory Failuredue to severe COPD exacerbation with previous COVID 19 infection and morbid obesity failure to wean from vent with severe metabolic encephalopathy   Severe ACUTE Hypoxic and Hypercapnic Respiratory Failure -continue Full MV support -continue Bronchodilator Therapy -Wean Fio2 and PEEP as tolerated -VAP/VENT bundle implementation WILL NEED TRACH AND PEG TUBE TO SURVIVE DNR STATUS PLAN FOR ONE WAY EXTUBATION IN NEXT 24-48 HRS   ACUTE DIASTOLIC CARDIAC FAILURE-  -oxygen as needed -Lasix as tolerated   Morbid obesity, possible OSA.   Will certainly impact respiratory mechanics, ventilator weaning Suspect will need to consider additional PEEP   ACUTE KIDNEY INJURY/Renal Failure -continue Foley Catheter-assess need -Avoid nephrotoxic agents -Follow urine output, BMP -Ensure adequate renal perfusion, optimize oxygenation -Renal dose medications     NEUROLOGY Acute toxic metabolic encephalopathy, need for sedation Goal RASS -2 to -3 Wake up assessment pending   CARDIAC ICU monitoring  ID -continue IV abx as prescibed -follow up  cultures  GI GI PROPHYLAXIS as indicated   DIET-->TF's as tolerated Constipation protocol as indicated  ENDO - will use ICU hypoglycemic\Hyperglycemia protocol if indicated     ELECTROLYTES -follow labs as needed -replace as needed -pharmacy consultation and following   DVT/GI PRX ordered and assessed TRANSFUSIONS AS NEEDED MONITOR FSBS I Assessed the need for Labs I Assessed the need for Foley I Assessed the need for Central Venous Line Family Discussion when available I Assessed the need for Mobilization I made an Assessment of medications to be adjusted accordingly Safety Risk assessment completed   CASE  DISCUSSED IN MULTIDISCIPLINARY ROUNDS WITH ICU TEAM  Critical Care Time devoted to patient care services described in this note is 46  minutes.   Overall, patient is critically ill, prognosis is guarded.  Patient with Multiorgan failure and at high risk for cardiac arrest and death.    Corrin Parker, M.D.  [facility]  Medical Director [place] Director [facility] Cardio-Pulmonary Department

## 2020-07-09 NOTE — Progress Notes (Signed)
Daily Progress Note   Patient Name: Kristopher R [place]       Date: 07/09/2020 DOB: 12/05/1945  Age: 75 y.o. MRN#: [id number] Attending Physician: Flora Lipps, MD Primary Care Physician: Patient, No Pcp Per Admit Date: 06/07/2020  Reason for Consultation/Follow-up: Establishing goals of care  Subjective: Spoke with wife and daughter at bedside. CCM in to speak with them. Plans for 1 way extubation later this week. She is clear she would not want Thursday as that is their anniversary.    Length of Stay: 12  Current Medications: Scheduled Meds:  . vitamin C  500 mg Per Tube Daily  . budesonide (PULMICORT) nebulizer solution  0.5 mg Nebulization BID  . chlorhexidine gluconate (MEDLINE KIT)  15 mL Mouth Rinse BID  . Chlorhexidine Gluconate Cloth  6 each Topical Daily  . docusate  100 mg Per Tube BID  . enoxaparin (LOVENOX) injection  0.5 mg/kg Subcutaneous Q24H  . famotidine  20 mg Per Tube Daily  . feeding supplement (PROSource TF)  45 mL Per Tube 5 X Daily  . free water  200 mL Per Tube Q4H  . furosemide  40 mg Intravenous Daily  . insulin aspart  0-9 Units Subcutaneous Q4H  . ipratropium-albuterol  3 mL Nebulization BID  . lactulose  10 g Per Tube BID  . mouth rinse  15 mL Mouth Rinse 10 times per day  . methylPREDNISolone (SOLU-MEDROL) injection  40 mg Intravenous Q12H  . polyethylene glycol  17 g Per Tube Daily  . QUEtiapine  50 mg Per Tube BID  . sodium chloride flush  10-40 mL Intracatheter Q12H  . zinc sulfate  220 mg Per Tube Daily    Continuous Infusions: . sodium chloride    . feeding supplement (VITAL 1.5 CAL) 1,000 mL (07/08/20 1641)  . fentaNYL infusion INTRAVENOUS 175 mcg/hr (07/09/20 0600)  . propofol (DIPRIVAN) infusion 20 mcg/kg/min (07/09/20 0532)    PRN  Meds: acetaminophen, acetaminophen, albuterol, fentaNYL, hydrALAZINE, sodium chloride flush  Physical Exam Constitutional:      Comments: Eyes closed.   Pulmonary:     Comments: On ventilator. Using accessory muscles to breathe.             Vital Signs: BP (!) 99/56   Pulse (!) 112   Temp (!) 100.4 F (38 C) (Esophageal)  Resp 14   Ht '5\' 10"'  (1.778 m)   Wt 114.1 kg   SpO2 95%   BMI 36.09 kg/m  SpO2: SpO2: 95 % O2 Device: O2 Device: Ventilator O2 Flow Rate: O2 Flow Rate (L/min): 6 L/min  Intake/output summary:   Intake/Output Summary (Last 24 hours) at 07/09/2020 4920 Last data filed at 07/09/2020 0600 Gross per 24 hour  Intake 1443.45 ml  Output 1685 ml  Net -241.55 ml   LBM: Last BM Date: 07/08/20 Baseline Weight: Weight: 122.5 kg Most recent weight: Weight: 114.1 kg        Flowsheet Rows   Flowsheet Row Most Recent Value  Intake Tab   Referral Department Critical care  Unit at Time of Referral ICU  Palliative Care Primary Diagnosis Pulmonary  Date Notified 06/29/20  Palliative Care Type New Palliative care  Reason for referral Clarify Goals of Care  Date of Admission 06/25/2020  Date first seen by Palliative Care 06/29/20  # of days Palliative referral response time 0 Day(s)  # of days IP prior to Palliative referral 2  Clinical Assessment   Psychosocial & Spiritual Assessment   Palliative Care Outcomes       Patient Active Problem List   Diagnosis Date Noted  . COPD exacerbation ([place]) 06/19/2020  . Acute on chronic respiratory failure with hypoxia ([place]) 06/12/2020  . COVID-19 virus infection 06/29/2020  . Hypokalemia 06/19/2020  . Acute metabolic encephalopathy 03/05/1218  . Acute respiratory failure ([place]) 06/23/2020  . Pain due to onychomycosis of toenails of both feet 12/03/2018  . COPD (chronic obstructive pulmonary disease) ([place]) 11/28/2017  . H/O asbestos exposure 11/28/2017  . HLD (hyperlipidemia) 11/28/2017  . HTN (hypertension) 11/28/2017   . Obesity, unspecified 11/28/2017  . Former cigarette smoker 06/05/2014    Palliative Care Assessment & Plan    Recommendations/Plan: 1 way extubation Friday or Saturday. Continue current care.    Code Status:    Code Status Orders  (From admission, onward)         Start     Ordered   07/02/20 1538  Do not attempt resuscitation (DNR)  Continuous       Question Answer Comment  In the event of cardiac or respiratory ARREST Do not call a "code blue"   In the event of cardiac or respiratory ARREST Do not perform Intubation, CPR, defibrillation or ACLS   In the event of cardiac or respiratory ARREST Use medication by any route, position, wound care, and other measures to relive pain and suffering. May use oxygen, suction and manual treatment of airway obstruction as needed for comfort.      07/02/20 1537        Code Status History    Date Active Date Inactive Code Status Order ID Comments User Context   06/26/2020 2001 07/02/2020 1537 Full Code [id number]  Rust-Chester, Huel Cote, NP ED   06/13/2020 1500 06/12/2020 2001 Full Code [id number]  Ivor [place], MD ED   Advance Care Planning Activity      Prognosis: Poor    Care plan was discussed with CCM  Thank you for allowing the Palliative Medicine Team to assist in the care of this patient.   Total Time 15 min Prolonged Time Billed  no       Greater than 50%  of this time was spent counseling and coordinating care related to the above assessment and plan.  Asencion Gowda, NP  Please contact Palliative Medicine Team phone at [phone] for questions and concerns.

## 2020-07-09 NOTE — Progress Notes (Signed)
Pt with onset of rapid AFib w/ RVR approx 1010 this AM.   Rate as high as 200.  Amiodarone bolused and infusion started.  Rate no lower than 130 rest of shift.  BP steadily decreased t/o shift and MAP currently in 50s w/ SBP in 70s.  I spoke with patient's wife and asked if she wanted [place] to do anything further to address these issues, up to and including consulting with cardiology and adding more medications.  She looked at the monitor and stated "I don't think so.  I think this is just part of it."  She did seem concerned about continuing fevers and Dr Belia Heman ordered ABX.  Family declined any further escalation.  Added Trans Scop patch and glycopyrrolate for secretions.  Pt seems more comfortable.  We will keep TF for now.  No attempts to wake him up per MD.

## 2020-07-09 NOTE — Consult Note (Signed)
Pharmacy Antibiotic Note  Benyamin R [place] is a 75 y.o. male admitted on 06/29/2020 with acute hypercapnic respiratory failure, requiring rapid sequence intubation and mechanical ventilation and admission to ICU.  Pharmacy has been consulted for Vancomycin and Cefepime dosing for HAP  Scr last 48 hrs: 1.8>2.52>3.47  Plan:  Initiate cefepime 2 gram Q12H based on current renal function  Vancomycin 2500 mg IV x 1 loading dose   Given current AKI will hold off scheduling maintenance regimen at this time. Anticipate 1000 mg Q48 with current Scr 3.47  Follow renal function daily for dose adjustments  Vancomycin levels at steady state if warranted    Height: 5\' 10"  (177.8 cm) Weight: 114.1 kg (251 lb 8.7 oz) IBW/kg (Calculated) : 73  Temp (24hrs), Avg:100.4 F (38 C), Min:100.1 F (37.8 C), Max:100.6 F (38.1 C)  Recent Labs  Lab 07/05/20 1006 07/06/20 0635 07/07/20 0554 07/08/20 0603 07/09/20 0723  WBC 11.8* 12.3* 14.4* 15.6* 14.7*  CREATININE 1.32* 1.44* 1.80* 2.52* 3.47*    Estimated Creatinine Clearance: 22.9 mL/min (A) (by C-G formula based on SCr of 3.47 mg/dL (H)).    Allergies  Allergen Reactions  . Lisinopril   . Tamsulosin   . Tramadol     Antimicrobials this admission: 1/29 Azithromycin >> 1/30 2/10 vancomycin >>  2/10 Cefepime >>  Dose adjustments this admission: n/a  Microbiology results: 2/6 Sputum: normal oral flora  1/29 MRSA PCR: neg  Thank you for allowing pharmacy to be a part of this patient's care.  2/29 07/09/2020 4:51 PM

## 2020-07-09 NOTE — Progress Notes (Signed)
Annabelle Harman NP made aware of patients HR Afib 150's-160's and continued hypotension. Also made aware per day shift wife wants no escalation of treatment.

## 2020-07-10 LAB — GLUCOSE, CAPILLARY
Glucose-Capillary: 226 mg/dL — ABNORMAL HIGH (ref 70–99)
Glucose-Capillary: 234 mg/dL — ABNORMAL HIGH (ref 70–99)
Glucose-Capillary: 253 mg/dL — ABNORMAL HIGH (ref 70–99)

## 2020-07-10 LAB — BASIC METABOLIC PANEL
Anion gap: 23 — ABNORMAL HIGH (ref 5–15)
BUN: 213 mg/dL — ABNORMAL HIGH (ref 8–23)
CO2: 29 mmol/L (ref 22–32)
Calcium: 7.7 mg/dL — ABNORMAL LOW (ref 8.9–10.3)
Chloride: 97 mmol/L — ABNORMAL LOW (ref 98–111)
Creatinine, Ser: 5.95 mg/dL — ABNORMAL HIGH (ref 0.61–1.24)
GFR, Estimated: 9 mL/min — ABNORMAL LOW (ref >60)
Glucose, Bld: 258 mg/dL — ABNORMAL HIGH (ref 70–99)
Potassium: 5.7 mmol/L — ABNORMAL HIGH (ref 3.5–5.1)
Sodium: 149 mmol/L — ABNORMAL HIGH (ref 135–145)

## 2020-07-10 LAB — CBC WITH DIFFERENTIAL/PLATELET
Abs Immature Granulocytes: 0.29 10*3/uL — ABNORMAL HIGH (ref 0.00–0.07)
Basophils Absolute: 0 10*3/uL (ref 0.0–0.1)
Basophils Relative: 0 %
Eosinophils Absolute: 0 10*3/uL (ref 0.0–0.5)
Eosinophils Relative: 0 %
HCT: 46.3 % (ref 39.0–52.0)
Hemoglobin: 13.7 g/dL (ref 13.0–17.0)
Immature Granulocytes: 1 %
Lymphocytes Relative: 4 %
Lymphs Abs: 0.7 10*3/uL (ref 0.7–4.0)
MCH: 29.3 pg (ref 26.0–34.0)
MCHC: 29.6 g/dL — ABNORMAL LOW (ref 30.0–36.0)
MCV: 98.9 fL (ref 80.0–100.0)
Monocytes Absolute: 1.4 10*3/uL — ABNORMAL HIGH (ref 0.1–1.0)
Monocytes Relative: 7 %
Neutro Abs: 18 10*3/uL — ABNORMAL HIGH (ref 1.7–7.7)
Neutrophils Relative %: 88 %
Platelets: 205 10*3/uL (ref 150–400)
RBC: 4.68 MIL/uL (ref 4.22–5.81)
RDW: 14.6 % (ref 11.5–15.5)
WBC: 20.5 10*3/uL — ABNORMAL HIGH (ref 4.0–10.5)
nRBC: 0.1 % (ref 0.0–0.2)

## 2020-07-10 LAB — TRIGLYCERIDES: Triglycerides: 226 mg/dL — ABNORMAL HIGH (ref <150)

## 2020-07-10 LAB — PHOSPHORUS: Phosphorus: 8.9 mg/dL — ABNORMAL HIGH (ref 2.5–4.6)

## 2020-07-10 LAB — MAGNESIUM: Magnesium: 3.5 mg/dL — ABNORMAL HIGH (ref 1.7–2.4)

## 2020-07-10 MED ORDER — SODIUM CHLORIDE 0.9 % IV SOLN: 2.0000 g | INTRAVENOUS | Status: DC

## 2020-07-20 ENCOUNTER — Telehealth: Payer: Self-pay | Admitting: Internal Medicine

## 2020-07-20 NOTE — Telephone Encounter (Signed)
Alinda Money is returning phone call. Alinda Money phone number is [phone].

## 2020-07-20 NOTE — Telephone Encounter (Signed)
Lm for Kristopher Blake.

## 2020-07-20 NOTE — Telephone Encounter (Signed)
Called and spoke to patient's son, Alinda Money.  Alinda Money is requesting that Dr. Belia Heman complete a form stating that asbestosis was underline cause for death. If this dx is not listed, patient's spouse will not receive any benefits from the [employer].  Alinda Money will bring the form by our office today.  Dr. Belia Heman, please advise. thanks

## 2020-07-21 NOTE — Telephone Encounter (Signed)
Form has been completed and signed by Dr. Belia Heman. Alinda Money will come by and pick up form. Form has been placed in 'pickup bin' at front desk.  Nothing further needed at this time.

## 2020-07-28 NOTE — Progress Notes (Signed)
   07/07/2020 0835  Clinical Encounter Type  Visited With Patient and family together  Visit Type Initial;Psychological support;Spiritual support;Social support;Death  Referral From Nurse  Consult/Referral To Chaplain  Stress Factors  Family Stress Factors Loss of control;Loss;Exhausted   Chaplain responded to page from nurse. Nurse stated that [name] had just passed. Chaplain visited with the family and gave his condolences. Chaplain was a calming presence and used reflective listing to help the family. Chaplain read the 23rd Psalm as requested and prayed with the family.

## 2020-07-28 NOTE — Progress Notes (Addendum)
Patient passed away at 08:27- Patient's family at bedside. Dr. Belia Heman notified.  No initial assessment documented due to patient expiring prior to first assessment of shift.

## 2020-07-28 NOTE — Progress Notes (Signed)
Inpatient Diabetes Program Recommendations  AACE/ADA: New Consensus Statement on Inpatient Glycemic Control (2015)  Target Ranges:  Prepandial:   less than 140 mg/dL      Peak postprandial:   less than 180 mg/dL (1-2 hours)      Critically ill patients:  140 - 180 mg/dL   Results for Kristopher Blake, Kristopher Blake (MRN [id number]) as of 07/14/2020 08:37  Ref. Range 07/09/2020 00:06 07/09/2020 04:20 07/09/2020 07:42 07/09/2020 11:49 07/09/2020 16:02 07/09/2020 19:41  Glucose-Capillary Latest Ref Range: 70 - 99 mg/dL 970 (H) 263 (H) 785 (H) 216 (H) 219 (H) 193 (H)   Results for Kristopher Blake, Kristopher Blake (MRN [id number]) as of 07/15/2020 08:37  Ref. Range 07/23/2020 00:04 07/01/2020 03:42 07/25/2020 07:44  Glucose-Capillary Latest Ref Range: 70 - 99 mg/dL 287 (H)  3 units NOVOLOG  226 (H)  5 units NOVOLOG  253 (H)    Admit with: Acute on chronic respiratory failure with hypoxia due to COPD exacerbation/ AMS/ Recent COVID-19 infection (more than 21 days ago)  No History of Diabetes  Current Orders: Novolog Sensitive Correction Scale/ SSI (0-9 units) Q4 hours    Solumedrol 40 mg BID  Vital AF tube feeds running 60cc/hr    MD- If more aggressive glucose control within goals of care, please consider Adding Novolog Tube Feed Coverage:  Novolog 3 units Q4 hours  HOLD if Tube Feeds HELD for any reason    --Will follow patient during hospitalization--  Ambrose Finland RN, MSN, CDE Diabetes Coordinator Inpatient Glycemic Control Team Team Pager: [phone] (8a-5p)

## 2020-07-28 NOTE — Death Summary Note (Signed)
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, ISCHEMIC CARDIOMYOPATHY Secondary Diagnoses (including complications and co-morbidities):  Principal Problem:   COPD exacerbation ([place]) Active Problems:   HLD (hyperlipidemia)   HTN (hypertension)   Acute on chronic respiratory failure with hypoxia ([place])   COVID-19 virus infection   Hypokalemia   Acute metabolic encephalopathy   Acute respiratory failure [facility])   Brief Hospital Course (including significant findings, care, treatment, and services provided and events leading to death)   75 year old male post COVID-19 infection 06/05/2020 presenting to the ED with 2 days of cough and shortness of breath at home after discharge from the [place]. patient developed acute hypercapnic respiratory failure, becoming obtunded requiring rapid sequence intubation and mechanical ventilation admitted to ICU.  Recent COVID-19 infection on 06/05/2020 with hospitalization at the [place].  Upon discharge from the [place] patient was prescribed 3 L nasal cannula of oxygen to be worn around the clock at home. Per wife's report patient had a repeat Covid test that was -2 weeks ago.   Patient reported to the ED physician upon arrival that on the morning of 16-Jul-2020 he checked his SPO2 at home and it was 82% on 3 L and came to the ED to be evaluated. He denied chest pain/recent fever.  ED course: D-dimer elevated >>CTA negative for PE &troponins flat at 10 x2. Due to concern for COPD exacerbation, patient received 2 duo nebs and a dose of Solu-Medrol.  Additional work-up performed for acute  encephalopathy with intermittent "jerks" causing concern for possible seizures activity including bilateral lower extremity ultrasound negative for DVT, consultation with neurology and Keppra administration. ABG revealed PCO2 at 148, PCCM stat consulted for possible need of emergent intubation and mechanical ventilation due to obtunded LOC.   SUBJECTIVE    07/16/20-admitted by [facility], became obtunded while rooming in the ED, PCO2 148, requiring RSI and mechanical ventilation. Admitted to the ICU 1/31 severe resp failure, failed weaning trials 2/1 severe resp failure 2/2 failed multiple weaning trials, severe resp failure 2/04- patient is critically ill, I met with family today and they had asked about prognosis which I explained is poor. I have allowed 1 person at a time to come in. Family is considering comfort care vs possibly tracheostomy.  07/04/2020- Patient on 40% FiO2, spoke with wife, shes is discussing goals of care with children and will speak with Palliative and PCCM team after.  07/05/2020-patient had awakening trial today, he was unable to follow any commands. Met with family today. 07/06/2020-ventilator asynchrony when sedation is lightened, readdress goals of care with patient's wife and daughter 2/8remains on vent 2/9 FAILED WEANING TRIALS      GOALS OF CARE DISCUSSION  The Clinical status was relayed to family in detail.  Updated and notified of patients medical condition.  Patient remains unresponsive and will not open eyes to command.    Patient is having a weak cough and struggling to remove secretions.   Patient with increased WOB and using accessory muscles to breathe Explained to family course of therapy and the modalities     Patient with Progressive multiorgan failure  with a very high probablity of a very minimal chance of meaningful recovery despite all aggressive and optimal medical therapy. Patient is in the Dying  Process associated with  Suffering.  Family understands the situation.  They have consented and agreed to DNR/DNI , plan was to proceed with one way extubation but patient passes away before that plan was initiated  Family are satisfied with Plan of action and management. All questions answered     Pertinent Labs and Studies  Significant Diagnostic Studies EEG  Result Date: 06/29/2020 Lora Havens, MD     06/29/2020  3:35 PM Patient Name: Kristopher Blake MRN: [id number] Epilepsy Attending: Lora Havens Referring Physician/Provider: Dr Ivor Costa Date: 06/29/2020 Duration: 22.48 mins Patient history: 75yo M with ams. EEG to evaluate for seizure Level of alertness:  comatose AEDs during EEG study: Propofol Technical aspects: This EEG study was done with scalp electrodes positioned according to the 10-20 International system of electrode placement. Electrical activity was acquired at a sampling rate of '500Hz'  and reviewed with a high frequency filter of '70Hz'  and a low frequency filter of '1Hz' . EEG data were recorded continuously and digitally stored. Description: EEG showed continuou generalized 3 to 6 Hz theta-delta slowing. Generalized periodic discharges with triphasic morphology at '1Hz'  were also noted. Hyperventilation and photic stimulation were not performed.   ABNORMALITY -Continuous slow, generalized -Periodic discharges with triphasic morphology, generalized IMPRESSION: This study is suggestive of severe diffuse encephalopathy, nonspecific etiology. Periodic discharges with triphasic morphology were noted which are on the ictal-interictal continuum. No seizures  were seen throughout the recording. Lora Havens   DG Chest 1 View  Result Date: 06/06/2020 CLINICAL DATA:  Endotracheal tube placement. EXAM: CHEST  1 VIEW COMPARISON:  Radiograph and CT earlier today. FINDINGS: Endotracheal tube tip 4.1 cm from the carina. Enteric tube is in place, tip below the diaphragm in the stomach, side-port faintly visualized  in the region of the gastroesophageal junction. Multiple overlying monitoring devices. Lung volumes are low. Stable upper normal heart size. Unchanged mediastinal contours. Increasing opacities in the lung bases and left midlung zone, likely atelectasis. Right pleural thickening corresponding to pleural plaques on CT. No pneumothorax. IMPRESSION: 1. Endotracheal tube tip 4.1 cm from the carina. 2. Enteric tube with tip below the diaphragm in the stomach, side-port faintly visualized in the region of the gastroesophageal junction. 3. Low lung volumes with increasing bibasilar opacities, likely atelectasis. Electronically Signed   By: Keith Rake M.D.   On: 06/06/2020 19:57   DG Abd 1 View  Result Date: 07/02/2020 CLINICAL DATA:  Orogastric tube placement. Ventilator dependent respiratory failure. EXAM: ABDOMEN - 1 VIEW COMPARISON:  06/28/2020 FINDINGS: Orogastric tube tip is now seen overlying the gastric antrum. No evidence of dilated bowel loops. IMPRESSION: Orogastric tube tip overlies the gastric antrum. Electronically Signed   By: Marlaine Hind M.D.   On: 07/02/2020 12:16   DG Abd 1 View  Result Date: 06/28/2020 CLINICAL DATA:  Check gastric catheter placement EXAM: ABDOMEN - 1 VIEW COMPARISON:  None. FINDINGS: Gastric catheter is noted within the stomach. Scattered large and small bowel gas is noted. No obstructive changes are seen. IMPRESSION: Gastric catheter within the stomach. Electronically Signed   By: Inez Catalina M.D.   On: 06/28/2020 00:33   CT HEAD WO CONTRAST  Result Date: 06/28/2020 CLINICAL DATA:  Shortness of breath, shaking. EXAM: CT HEAD WITHOUT CONTRAST TECHNIQUE: Contiguous axial images were obtained from the base of the skull through the  vertex without intravenous contrast. COMPARISON:  Head CT dated 06/03/2020. FINDINGS: Brain: Ventricles are within normal limits in size and configuration. There is no mass, hemorrhage, edema or other evidence of acute parenchymal abnormality.  No extra-axial hemorrhage. Vascular: Chronic calcified atherosclerotic changes of the large vessels at the skull base. No unexpected hyperdense vessel. Skull: Normal. Negative for fracture or focal lesion. Sinuses/Orbits: Again noted is fluid/mucosal thickening within the visualized portion of the RIGHT maxillary sinus. Additional mucosal thickening and/or fluid throughout the ethmoid air cells and lower portion of the LEFT frontal sinus, of uncertain chronicity. Periorbital and retro-orbital soft tissues are unremarkable. Other: None. IMPRESSION: 1. No acute intracranial abnormality. No intracranial mass, hemorrhage or edema. 2. Paranasal sinus disease, of uncertain chronicity. Electronically Signed   By: Franki Cabot M.D.   On: 06/28/2020 10:16   CT HEAD WO CONTRAST  Result Date: 06/07/2020 CLINICAL DATA:  Altered mental status EXAM: CT HEAD WITHOUT CONTRAST TECHNIQUE: Contiguous axial images were obtained from the base of the skull through the vertex without intravenous contrast. COMPARISON:  None. FINDINGS: Brain: No evidence of acute infarction, hemorrhage, hydrocephalus, extra-axial collection or mass lesion/mass effect. Vascular: No hyperdense vessel or unexpected calcification. Skull: Normal. Negative for fracture or focal lesion. Sinuses/Orbits: Orbits and their contents appear within normal limits. Paranasal sinuses demonstrate mucosal thickening as well as air-fluid level within the right maxillary antrum consistent with sinusitis. Other: None IMPRESSION: Changes consistent with sinusitis primarily in the right maxillary antrum. No other focal abnormality is noted. Electronically Signed   By: Inez Catalina M.D.   On: 06/05/2020 23:19   CT Angio Chest PE W and/or Wo Contrast  Result Date: 06/02/2020 CLINICAL DATA:  Three-week post COVID.  PE suspected. EXAM: CT ANGIOGRAPHY CHEST WITH CONTRAST TECHNIQUE: Multidetector CT imaging of the chest was performed using the standard protocol during bolus  administration of intravenous contrast. Multiplanar CT image reconstructions and MIPs were obtained to evaluate the vascular anatomy. CONTRAST:  150m OMNIPAQUE IOHEXOL 350 MG/ML SOLN COMPARISON:  Chest x-ray from earlier same day. FINDINGS: Cardiovascular: There is no pulmonary embolism identified within the main, lobar or segmental pulmonary arteries bilaterally. No thoracic aortic aneurysm or evidence of aortic dissection. Scattered aortic atherosclerosis. Scattered coronary artery calcifications, particularly dense within the LEFT main and LEFT circumflex coronary arteries. No pericardial effusion. Mediastinum/Nodes: Mediastinal lipomatosis. No mass or enlarged lymph nodes are seen within the mediastinum or perihilar regions. Esophagus appears normal. Trachea is unremarkable. Lungs/Pleura: Pleural plaques bilaterally. Asymmetrically prominent pleural thickening with pleural plaques in the RIGHT upper lobe. Mild bibasilar atelectasis, likely chronic. No pleural effusion or pneumothorax. Upper Abdomen: Limited images of the upper abdomen are unremarkable. Musculoskeletal: Degenerative spondylosis of the slightly scoliotic thoracic spine, mild to moderate in degree. No acute appearing osseous abnormality. Review of the MIP images confirms the above findings. IMPRESSION: 1. No acute findings. No pulmonary embolism seen. No evidence of pneumonia or pulmonary edema. 2. Pleural plaques bilaterally, compatible with sequela of asbestos exposure. Asymmetrically prominent pleural thickening in the RIGHT upper lobe, with mesothelioma not excluded. Consider PET-CT for further characterization. 3. Scattered coronary artery calcifications, particularly dense within the LEFT main and circumflex coronary arteries. Recommend correlation with any possible associated cardiac symptoms. Aortic Atherosclerosis (ICD10-I70.0). Electronically Signed   By: SFranki CabotM.D.   On: 06/19/2020 08:54   MR BRAIN WO CONTRAST  Result Date:  07/01/2020 CLINICAL DATA:  Anoxic brain injury.  COVID-19.  Possible seizures. EXAM: MRI HEAD WITHOUT CONTRAST TECHNIQUE: Multiplanar, multiecho pulse  sequences of the brain and surrounding structures were obtained without intravenous contrast. COMPARISON:  CT head 06/28/2020. FINDINGS: Brain: No acute infarction, hemorrhage, hydrocephalus, extra-axial collection or mass lesion. There are a few punctate T2/FLAIR hyperintensities within the white matter, likely related to mild chronic microvascular ischemic disease. Vascular: Major arterial flow voids are maintained at the skull base. Skull and upper cervical spine: Normal marrow signal. Sinuses/Orbits: Scattered mucosal thickening with air-fluid levels. Other: Small bilateral mastoid effusions.  Fluid in the pharynx. IMPRESSION: 1. No evidence of acute intracranial abnormality. 2. Paranasal sinus disease and small bilateral mastoid effusions, most likely related to the patient's intubated status. Electronically Signed   By: Margaretha Sheffield MD   On: 07/01/2020 14:58   US Venous Img Lower Bilateral (DVT)  Result Date: 06/28/2020 CLINICAL DATA:  Positive D-dimer.  No pulmonary emboli EXAM: Bilateral LOWER EXTREMITY VENOUS DOPPLER ULTRASOUND TECHNIQUE: Gray-scale sonography with compression, as well as color and duplex ultrasound, were performed to evaluate the deep venous system(s) from the level of the common femoral vein through the popliteal and proximal calf veins. COMPARISON:  None. FINDINGS: VENOUS Normal compressibility of the common femoral, superficial femoral, and popliteal veins, as well as the visualized calf veins. Visualized portions of profunda femoral vein and great saphenous vein unremarkable. No filling defects to suggest DVT on grayscale or color Doppler imaging. Doppler waveforms show normal direction of venous flow, normal respiratory plasticity and response to augmentation. Limited views of the contralateral common femoral vein are  unremarkable. OTHER None. Limitations: none IMPRESSION: No evidence of deep venous thrombosis within the LEFT or RIGHT lower extremity. Electronically Signed   By: Suzy Bouchard M.D.   On: 06/25/2020 13:52   DG Chest Port 1 View  Result Date: 07/07/2020 CLINICAL DATA:  Acute respiratory failure EXAM: PORTABLE CHEST 1 VIEW COMPARISON:  07/04/2020 FINDINGS: The endotracheal tube terminates above the carina. The enteric tube extends below the left hemidiaphragm. There are diffuse bibasilar airspace opacities which have worsened since the prior study. There are growing bilateral pleural effusions. The heart size is stable. There is no pneumothorax. There is persistent vascular congestion. IMPRESSION: 1. The endotracheal tube appears to terminate at the level of the carina. Consider retracting the tube by approximately 1 cm. The remaining lines and tubes are stable. 2. Worsening bibasilar airspace disease favored to represent a combination of atelectasis and growing bilateral pleural effusions. Electronically Signed   By: Constance Holster M.D.   On: 07/07/2020 01:51   DG Chest Port 1 View  Result Date: 07/04/2020 CLINICAL DATA:  Acute respiratory failure with hypoxia EXAM: PORTABLE CHEST 1 VIEW COMPARISON:  07/02/2020 FINDINGS: Support devices are stable. Low lung volumes. Small bilateral effusions. Bibasilar opacities, likely atelectasis, slightly worsened on the left and improved on the right. Heart is upper limits normal in size. IMPRESSION: Small bilateral effusions. Slight improved aeration at the right base with increasing opacity at the left base, likely atelectasis. Electronically Signed   By: Rolm Baptise M.D.   On: 07/04/2020 01:02   DG Chest Port 1 View  Result Date: 07/02/2020 CLINICAL DATA:  Acute respiratory failure. Endotracheally intubated. Orogastric tube placement. EXAM: PORTABLE CHEST 1 VIEW COMPARISON:  06/28/2020 FINDINGS: Endotracheal tube and orogastric tube remain in appropriate  position. Low lung volumes are again noted. Heart size is stable. Is bibasilar pulmonary opacity is again noted, likely due to small pleural effusions and bibasilar atelectasis. No evidence of pneumothorax. IMPRESSION: Endotracheal tube and orogastric tube in appropriate position. Stable low lung  volumes, with small bilateral pleural effusions and bibasilar atelectasis. Electronically Signed   By: Marlaine Hind M.D.   On: 07/02/2020 12:15   DG Chest Port 1 View  Result Date: 06/28/2020 CLINICAL DATA:  Acute respiratory failure EXAM: PORTABLE CHEST 1 VIEW COMPARISON:  Yesterday FINDINGS: Endotracheal tube with tip between the clavicular heads and carina. The enteric tube reaches the stomach. Extensive artifact from EKG leads. Low volume chest with streaky opacity correlating with scarring/atelectasis by recent CT. There are also pleural plaques with calcification consistent with asbestos related pleural disease. No visible effusion or air leak. IMPRESSION: Stable hardware positioning, low lung volumes, and bilateral scarring/atelectasis. Electronically Signed   By: Monte Fantasia M.D.   On: 06/28/2020 05:39   DG Chest Port 1 View  Result Date: 06/12/2020 CLINICAL DATA:  COPD.  COVID. EXAM: PORTABLE CHEST 1 VIEW COMPARISON:  None. FINDINGS: Low volume chest with hazy bilateral pulmonary density. No Kerley lines, definite effusion, or pneumothorax. Borderline cardiomegaly, accentuated by technique. IMPRESSION: Low volume chest with bilateral atelectasis or pneumonia Electronically Signed   By: Monte Fantasia M.D.   On: 05/30/2020 07:45    Microbiology Recent Results (from the past 240 hour(s))  Culture, respiratory (non-expectorated)     Status: None   Collection Time: 07/01/20 10:50 AM   Specimen: Tracheal Aspirate; Respiratory  Result Value Ref Range Status   Specimen Description   Final    TRACHEAL ASPIRATE Performed at [facility], [address], [place], [place] [address]     Special Requests   Final    NONE Performed at [facility], [address] [place], [place] [address]    Gram Stain   Final    FEW WBC PRESENT, PREDOMINANTLY PMN MODERATE GRAM POSITIVE COCCI MODERATE GRAM POSITIVE RODS    Culture   Final    FEW Normal respiratory flora-no Staph aureus or Pseudomonas seen Performed at [facility] [address], [place], [place] [address]    Report Status 07/04/2020 FINAL  Final  Culture, respiratory (non-expectorated)     Status: None   Collection Time: 07/05/20  7:34 PM   Specimen: Tracheal Aspirate; Respiratory  Result Value Ref Range Status   Specimen Description   Final    TRACHEAL ASPIRATE Performed at [facility], [address], [place], [place] [address]    Special Requests   Final    NONE Performed at [facility], [address] [place], [place] [address]    Gram Stain   Final    FEW WBC PRESENT, PREDOMINANTLY PMN MODERATE GRAM POSITIVE COCCI IN PAIRS IN CHAINS RARE GRAM NEGATIVE RODS    Culture   Final    FEW Normal respiratory flora-no Staph aureus or Pseudomonas seen Performed at [facility], [address] [place], [place] [address]    Report Status 07/08/2020 FINAL  Final    Lab Basic Metabolic Panel: Recent Labs  Lab 07/05/20 1006 07/06/20 0635 07/07/20 0554 07/08/20 0603 07/09/20 0723 Jul 13, 2020 0326  NA 147* 149* 150* 150* 155* 149*  K 3.7 3.4* 3.7 3.5 4.5 5.7*  CL 93* 94* 98 98 98 97*  CO2 40* 39* 37* 34* 37* 29  GLUCOSE 141* 146* 150* 226* 238* 258*  BUN 72* 69* 78* 111* 155* 213*  CREATININE 1.32* 1.44* 1.80* 2.52* 3.47* 5.95*  CALCIUM 9.4 9.2 9.1 8.5* 8.4* 7.7*  MG 2.3 2.5*  --   --  3.4* 3.5*  PHOS 4.0 3.4  --   --  8.8* 8.9*   Liver Function Tests: No results for input(s): AST, ALT, ALKPHOS, BILITOT, PROT, ALBUMIN in the last 168 hours. No results for input(s): LIPASE, AMYLASE in the last 168 hours. No results for input(s): AMMONIA in the last 168  hours. CBC: Recent Labs  Lab 07/06/20 0635 07/07/20 0554 07/08/20 0603 07/09/20 0723 Jul 18, 2020 0326  WBC 12.3* 14.4* 15.6* 14.7* 20.5*  NEUTROABS 9.5* 12.1* 13.9* 13.1* 18.0*  HGB 13.0 13.3 13.5 13.2 13.7  HCT 42.7 43.3 44.5 44.5 46.3  MCV 93.4 92.7 92.9 98.0 98.9  PLT 247 248 215 196 205   Cardiac Enzymes: No results for input(s): CKTOTAL, CKMB, CKMBINDEX, TROPONINI in the last 168 hours. Sepsis Labs: Recent Labs  Lab 07/07/20 0554 07/08/20 0603 07/09/20 0723 2020-07-18 0326  WBC 14.4* 15.6* 14.7* 20.5*     Primo Innis 2020-07-18, 8:55 AM

## 2020-07-28 DEATH — deceased

## 2022-10-08 IMAGING — DX DG ABDOMEN 1V
1 series · 1 of 1 positions shown · non-contrast
Comparison: 06/28/2020

CLINICAL DATA: Orogastric tube placement. Ventilator dependent
respiratory failure.

EXAM:
ABDOMEN - 1 VIEW

[abdomen supine]
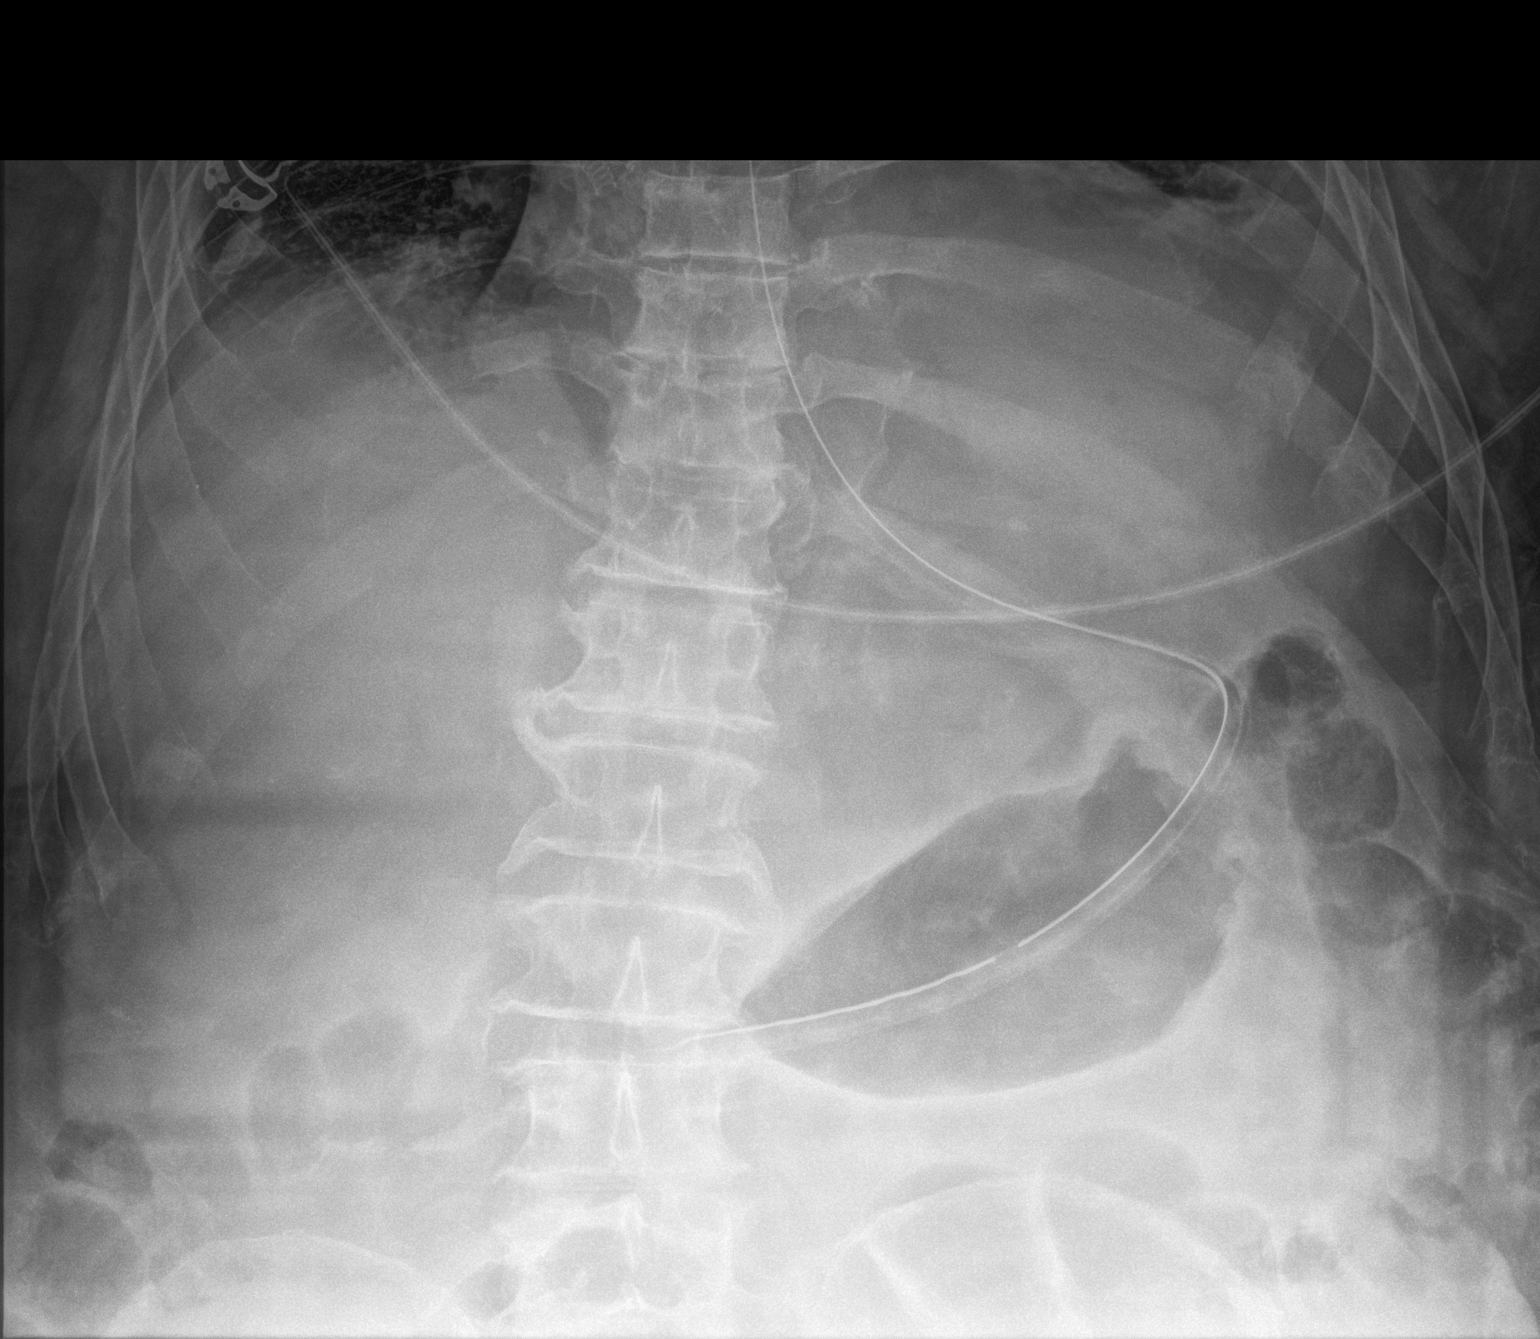

[1 of 1 positions shown; findings below may reference images not displayed]

FINDINGS: Orogastric tube tip is now seen overlying the gastric antrum. No
evidence of dilated bowel loops.
IMPRESSION: Orogastric tube tip overlies the gastric antrum.

## 2022-10-10 IMAGING — DX DG CHEST 1V PORT
1 series · 1 of 1 positions shown · non-contrast
Comparison: 07/02/2020

CLINICAL DATA: Acute respiratory failure with hypoxia

EXAM:
PORTABLE CHEST 1 VIEW

[chest ap]
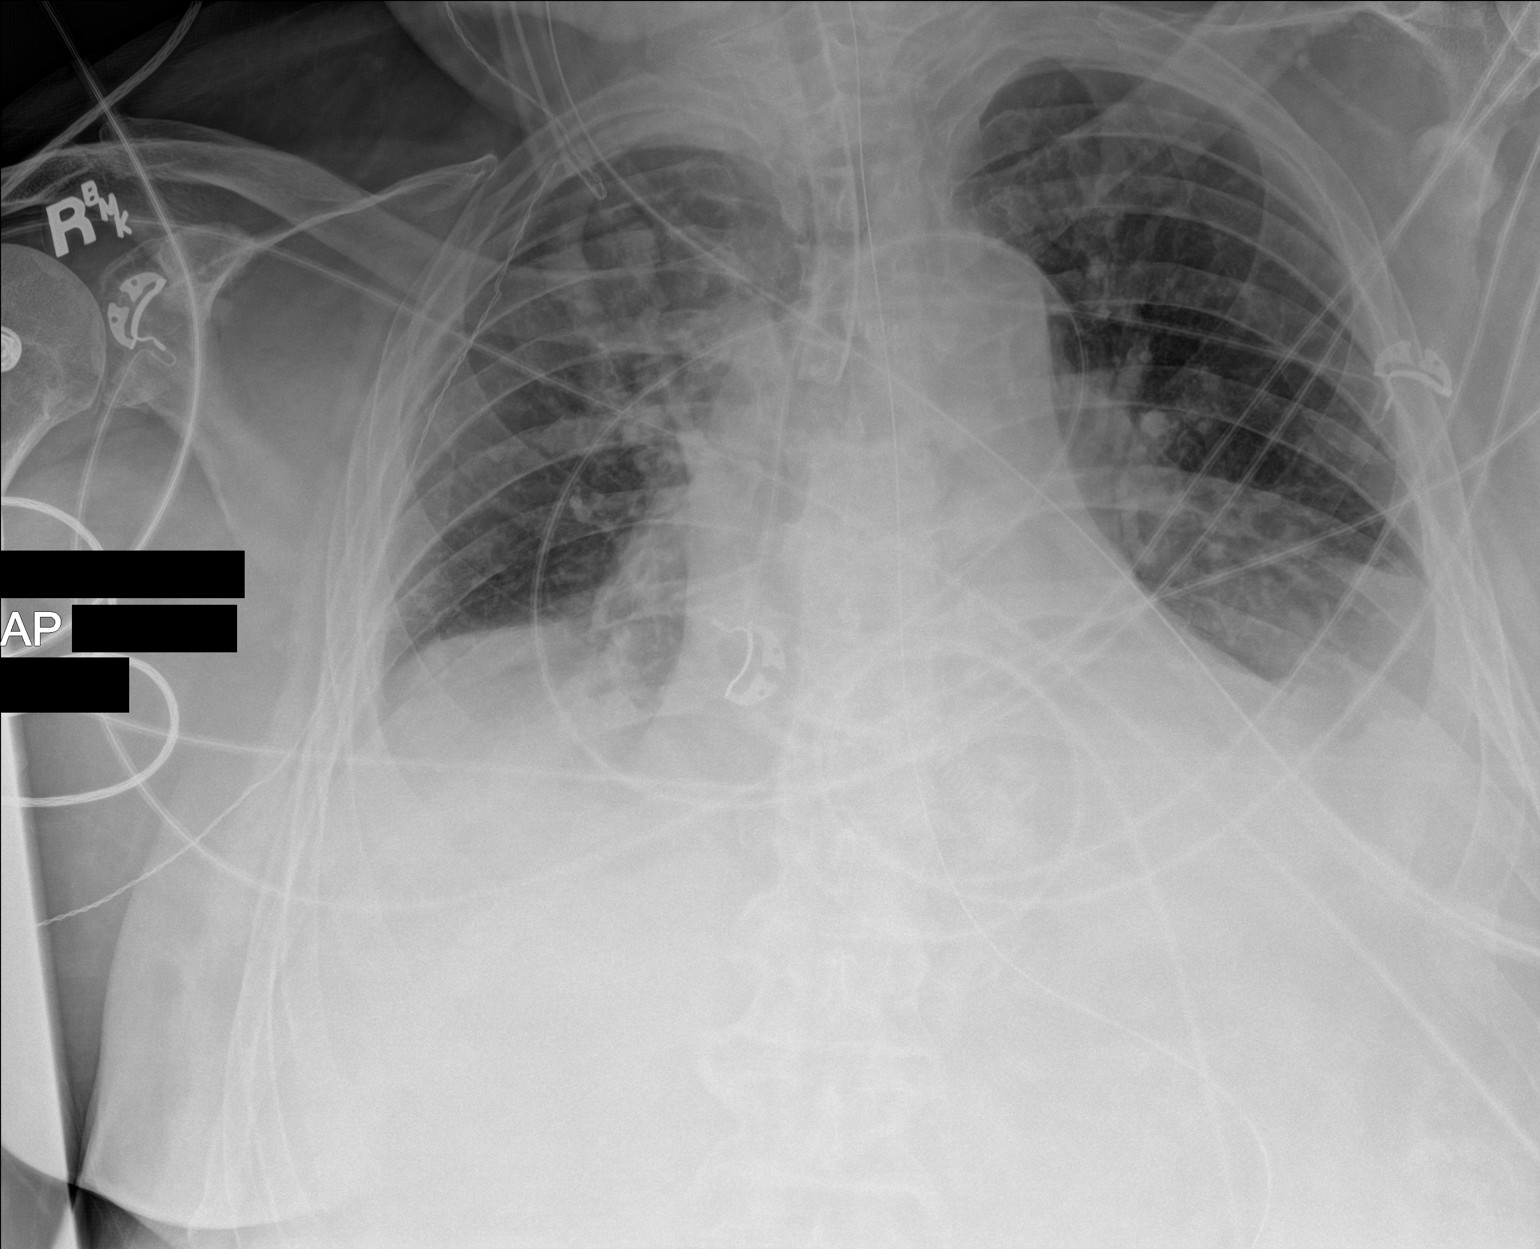

[1 of 1 positions shown; findings below may reference images not displayed]

FINDINGS: Support devices are stable. Low lung volumes. Small bilateral
effusions. Bibasilar opacities, likely atelectasis, slightly
worsened on the left and improved on the right. Heart is upper
limits normal in size.
IMPRESSION: Small bilateral effusions. Slight improved aeration at the right
base with increasing opacity at the left base, likely atelectasis.

## 2022-10-13 IMAGING — DX DG CHEST 1V PORT
1 series · 1 of 1 positions shown · non-contrast
Comparison: 07/04/2020

CLINICAL DATA: Acute respiratory failure

EXAM:
PORTABLE CHEST 1 VIEW

[chest ap]
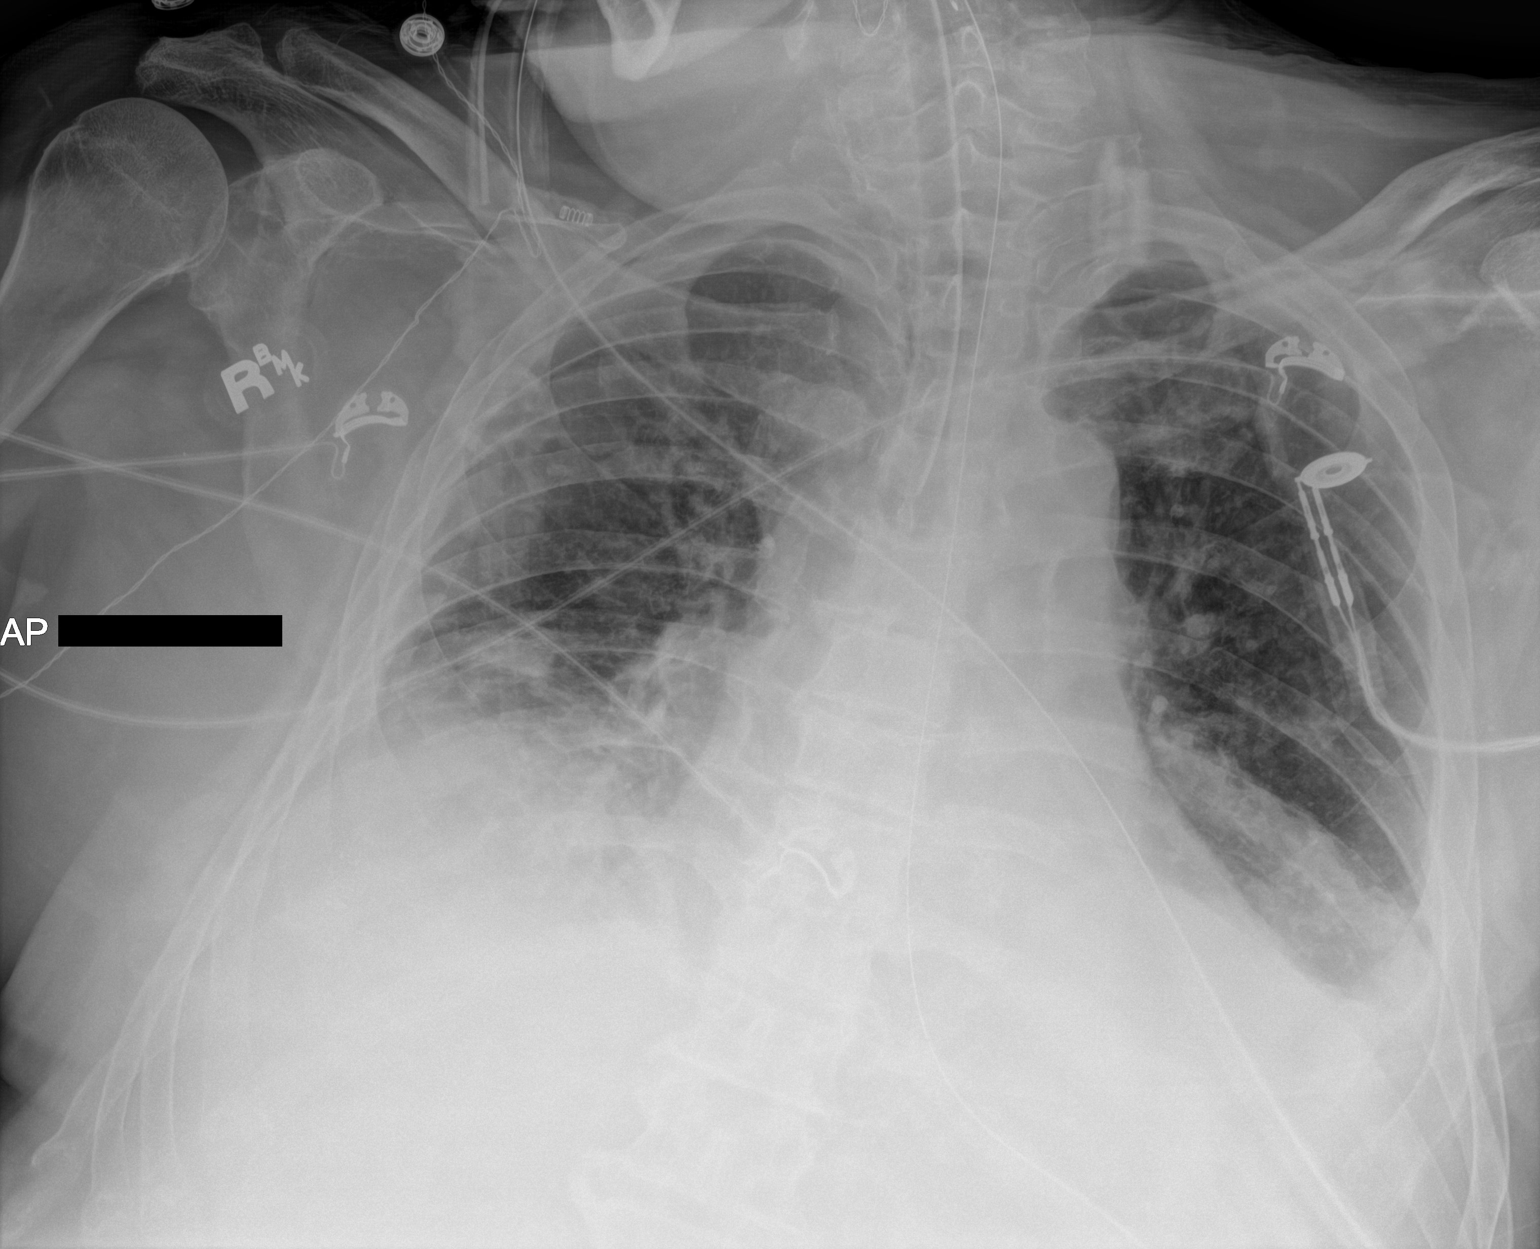

[1 of 1 positions shown; findings below may reference images not displayed]

FINDINGS: The endotracheal tube terminates above the carina. The enteric tube
extends below the left hemidiaphragm. There are diffuse bibasilar
airspace opacities which have worsened since the prior study. There
are growing bilateral pleural effusions. The heart size is stable.
There is no pneumothorax. There is persistent vascular congestion.
IMPRESSION: 1. The endotracheal tube appears to terminate at the level of the
carina. Consider retracting the tube by approximately 1 cm. The
remaining lines and tubes are stable.
2. Worsening bibasilar airspace disease favored to represent a
combination of atelectasis and growing bilateral pleural effusions.
